# Patient Record
Sex: Female | Born: 1990 | Race: Black or African American | Hispanic: No | Smoking: Never smoker
Health system: Southern US, Community
[De-identification: ages and names within clinical notes are randomized; demographics above are authoritative.]

## PROBLEM LIST (undated history)

## (undated) ENCOUNTER — Emergency Department (HOSPITAL_COMMUNITY): Admission: EM | Payer: Medicaid Other | Source: Home / Self Care

## (undated) DIAGNOSIS — Z789 Other specified health status: Secondary | ICD-10-CM

## (undated) DIAGNOSIS — O139 Gestational [pregnancy-induced] hypertension without significant proteinuria, unspecified trimester: Secondary | ICD-10-CM

## (undated) DIAGNOSIS — I10 Essential (primary) hypertension: Secondary | ICD-10-CM

## (undated) HISTORY — DX: Essential (primary) hypertension: I10

## (undated) HISTORY — DX: Gestational (pregnancy-induced) hypertension without significant proteinuria, unspecified trimester: O13.9

---
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
## 2010-09-07 IMAGING — CR DG NECK SOFT TISSUE
2 series · 2 of 2 positions shown · non-contrast
Comparison: None.

CLINICAL DATA: Short of breath.  Something is stuck in throat.

NECK SOFT TISSUES - 1+ VIEW

[view not recorded (1 of 2)]
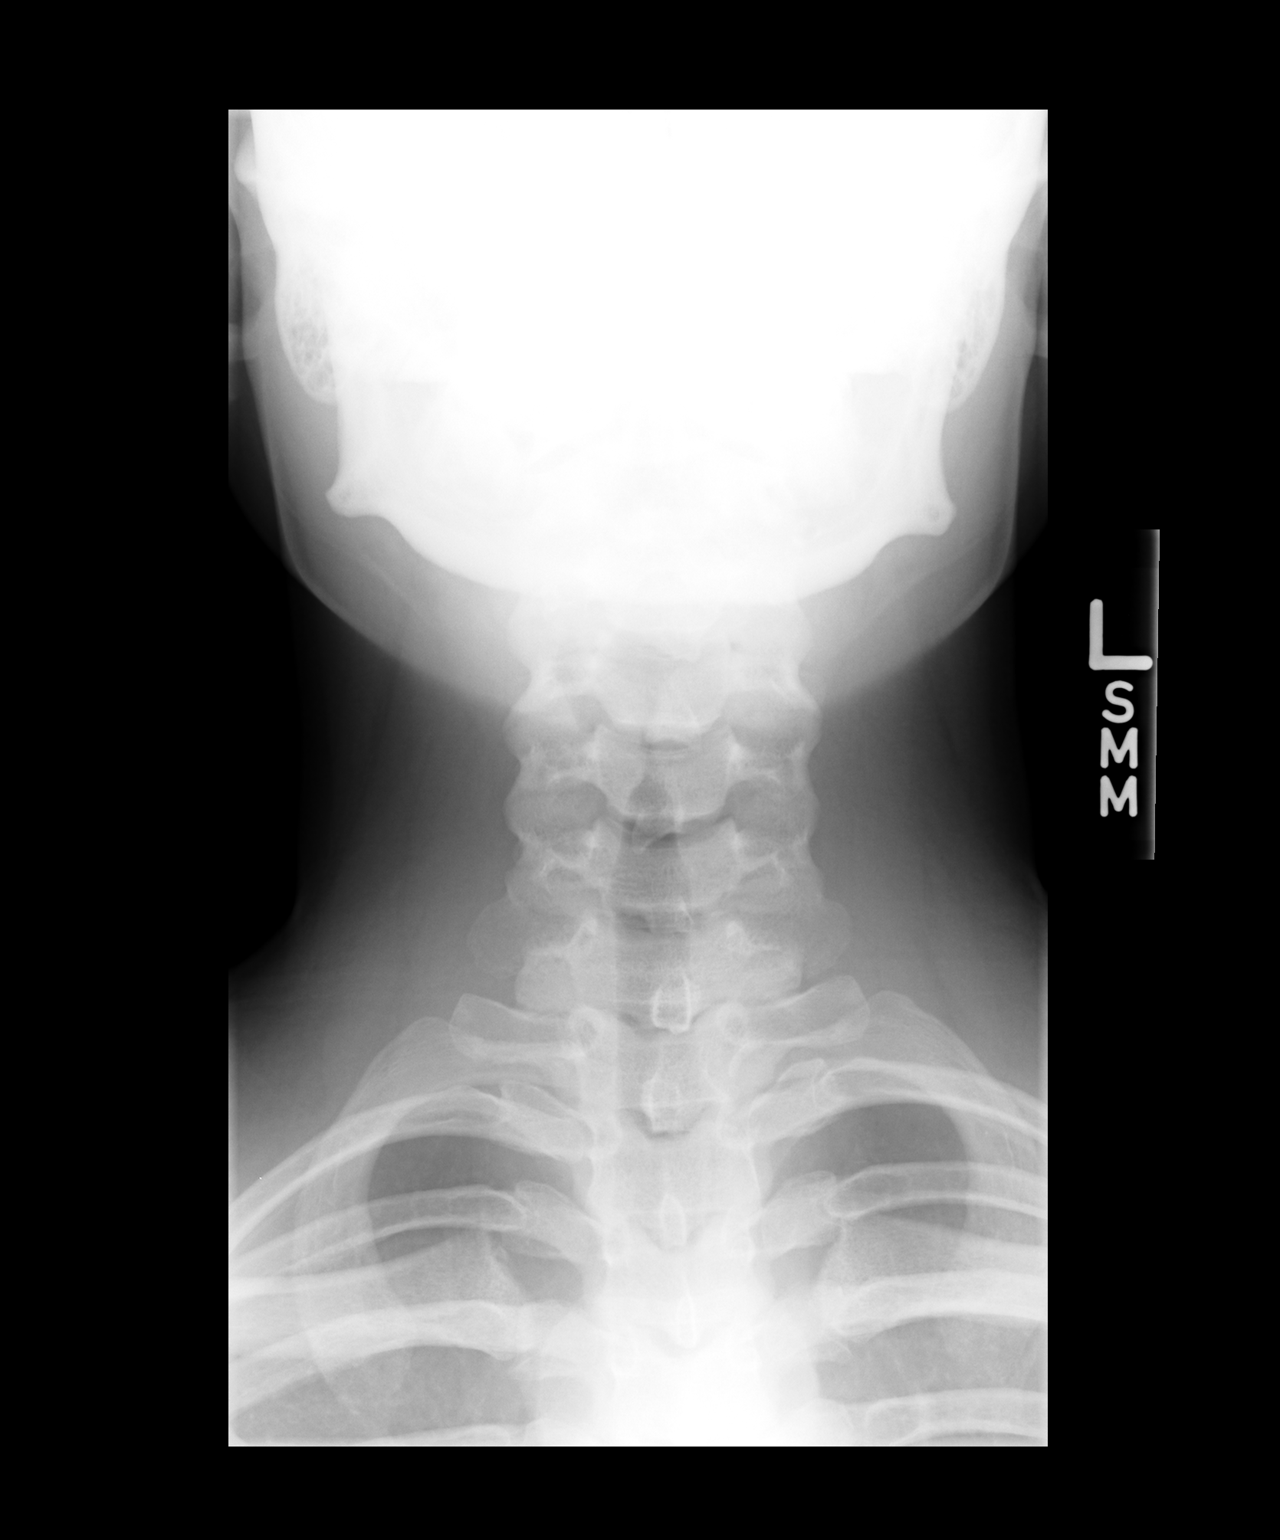

[view not recorded (2 of 2)]
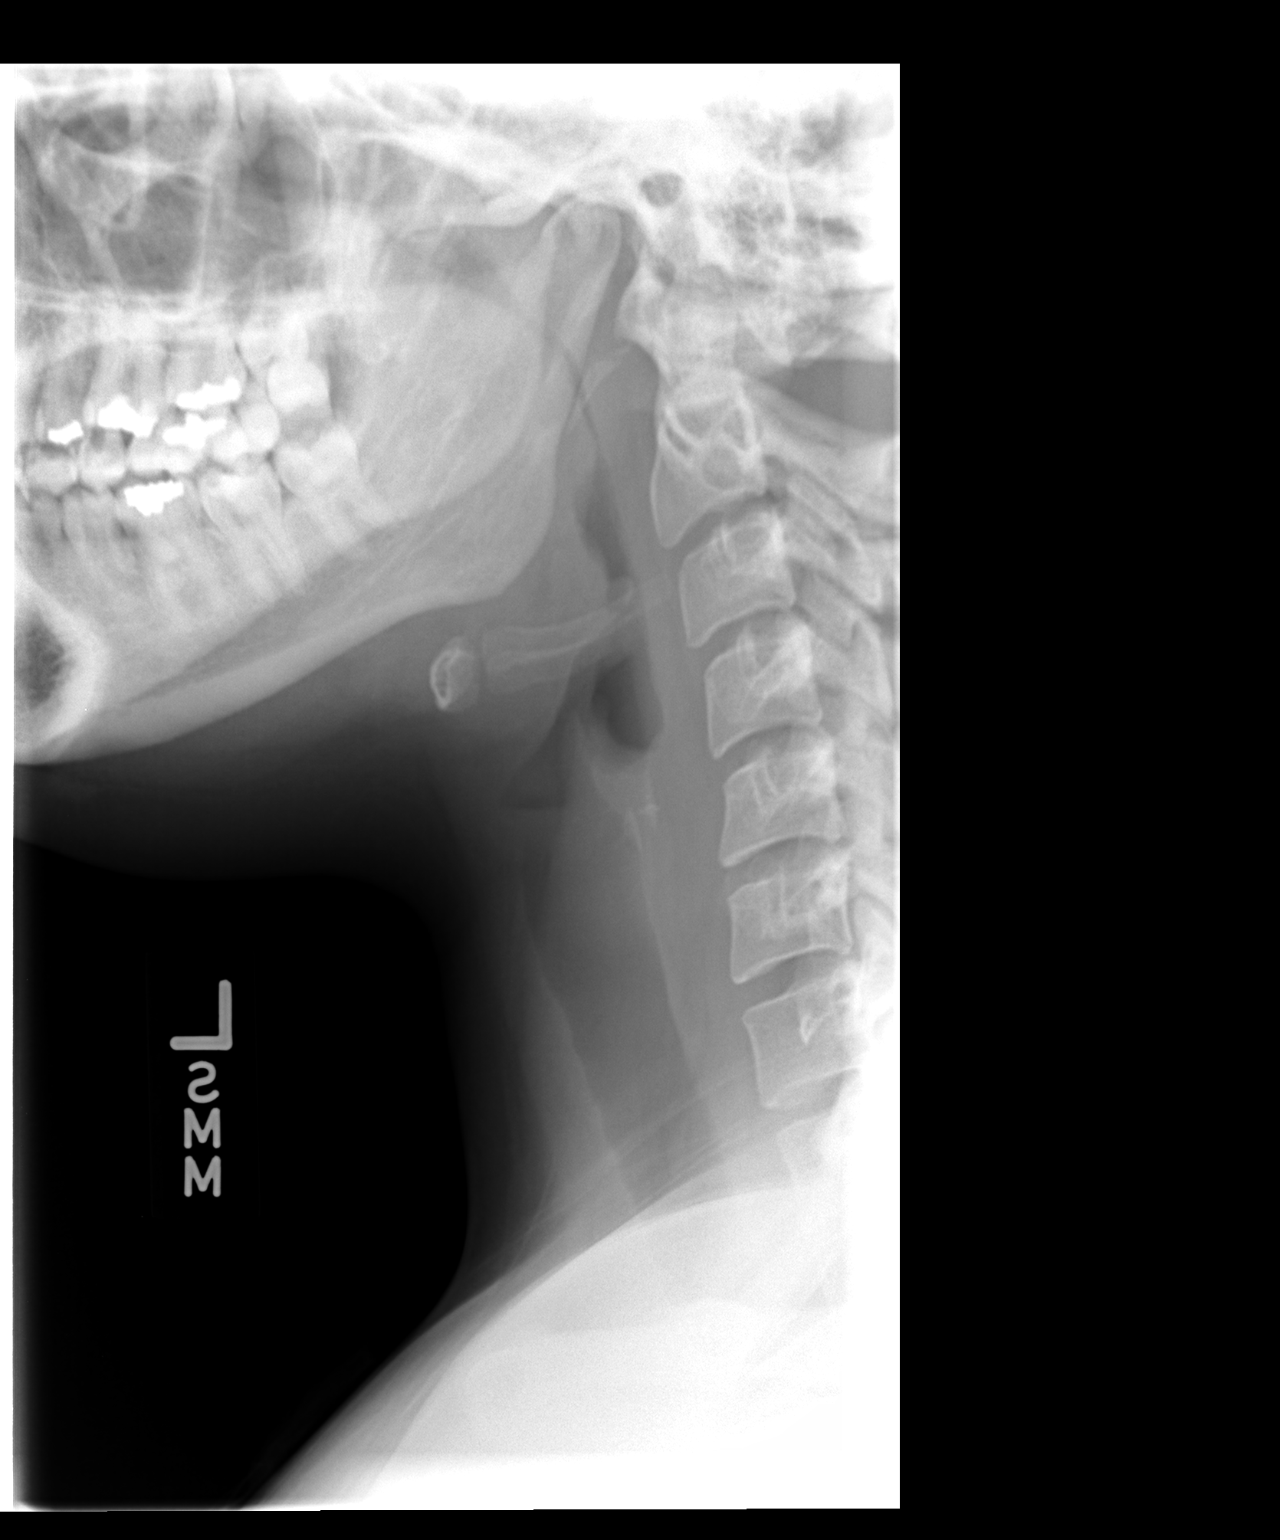

[2 of 2 positions shown; findings below may reference images not displayed]

FINDINGS: There is mild prominence of the adenoid soft tissues in
the nasopharynx which is likely due to lymphoid hyperplasia.  The
epiglottis is normal.  The glottis is normal.  The airway is normal
and the trachea appears normal.  There is no foreign body or soft
tissue swelling.
IMPRESSION: Negative

## 2011-01-04 LAB — POCT I-STAT, CHEM 8
BUN: 7 mg/dL (ref 6–23)
Calcium, Ion: 1.17 mmol/L (ref 1.12–1.32)
Chloride: 106 mEq/L (ref 96–112)
TCO2: 24 mmol/L (ref 0–100)

## 2011-01-04 LAB — POCT URINALYSIS DIP (DEVICE)
Glucose, UA: NEGATIVE mg/dL
Nitrite: NEGATIVE

## 2011-01-04 LAB — POCT PREGNANCY, URINE: Preg Test, Ur: NEGATIVE

## 2013-09-03 ENCOUNTER — Emergency Department (HOSPITAL_COMMUNITY)
Admission: EM | Admit: 2013-09-03 | Discharge: 2013-09-03 | Disposition: A | Discharge status: Home / Self Care | Payer: Self-pay | Attending: Emergency Medicine | Admitting: Emergency Medicine

## 2013-09-03 DIAGNOSIS — E669 Obesity, unspecified: Secondary | ICD-10-CM | POA: Insufficient documentation

## 2013-09-03 DIAGNOSIS — Z3202 Encounter for pregnancy test, result negative: Secondary | ICD-10-CM | POA: Insufficient documentation

## 2013-09-03 DIAGNOSIS — R112 Nausea with vomiting, unspecified: Secondary | ICD-10-CM | POA: Insufficient documentation

## 2013-09-03 DIAGNOSIS — R51 Headache: Secondary | ICD-10-CM | POA: Insufficient documentation

## 2013-09-03 DIAGNOSIS — Z77098 Contact with and (suspected) exposure to other hazardous, chiefly nonmedicinal, chemicals: Secondary | ICD-10-CM

## 2013-09-03 DIAGNOSIS — R6889 Other general symptoms and signs: Secondary | ICD-10-CM | POA: Insufficient documentation

## 2013-09-03 DIAGNOSIS — T6091XA Toxic effect of unspecified pesticide, accidental (unintentional), initial encounter: Secondary | ICD-10-CM | POA: Insufficient documentation

## 2013-09-03 DIAGNOSIS — Y92009 Unspecified place in unspecified non-institutional (private) residence as the place of occurrence of the external cause: Secondary | ICD-10-CM | POA: Insufficient documentation

## 2013-09-03 DIAGNOSIS — R42 Dizziness and giddiness: Secondary | ICD-10-CM | POA: Insufficient documentation

## 2013-09-03 DIAGNOSIS — Y9389 Activity, other specified: Secondary | ICD-10-CM | POA: Insufficient documentation

## 2013-09-03 MED ORDER — IBUPROFEN 200 MG PO TABS
600.0000 mg | ORAL_TABLET | Freq: Once | ORAL | Status: AC
  Administered 2013-09-03: 600 mg via ORAL
  Filled 2013-09-03: qty 3

## 2013-09-03 MED ORDER — ONDANSETRON HCL 4 MG/2ML IJ SOLN
4.0000 mg | Freq: Once | INTRAMUSCULAR | Status: AC
  Administered 2013-09-03: 4 mg via INTRAVENOUS
  Filled 2013-09-03: qty 2

## 2013-09-03 NOTE — ED Notes (Signed)
Bed: WU98 Expected date:  Expected time:  Means of arrival:  Comments: NV
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
## 2013-09-03 NOTE — ED Notes (Signed)
Pt denies pain at present. States she is still having burning in her throat. Pt speaks with complete sentences. Pt with no acute distress. States she is still a little nauseous.

## 2013-09-03 NOTE — ED Notes (Signed)
MD at bedside. Dr. Horton at bedside.  

## 2014-05-27 ENCOUNTER — Emergency Department (HOSPITAL_COMMUNITY)
Admission: EM | Admit: 2014-05-27 | Discharge: 2014-05-27 | Disposition: A | Discharge status: Home / Self Care | Payer: Self-pay | Attending: Emergency Medicine | Admitting: Emergency Medicine

## 2014-05-27 ENCOUNTER — Encounter (HOSPITAL_COMMUNITY): Payer: Self-pay | Admitting: Emergency Medicine

## 2014-05-27 DIAGNOSIS — R42 Dizziness and giddiness: Secondary | ICD-10-CM | POA: Insufficient documentation

## 2014-05-27 DIAGNOSIS — Z3202 Encounter for pregnancy test, result negative: Secondary | ICD-10-CM | POA: Insufficient documentation

## 2014-05-27 LAB — I-STAT CHEM 8, ED
BUN: 12 mg/dL (ref 6–23)
CALCIUM ION: 1.16 mmol/L (ref 1.12–1.23)
CHLORIDE: 102 meq/L (ref 96–112)
CREATININE: 0.8 mg/dL (ref 0.50–1.10)
Glucose, Bld: 85 mg/dL (ref 70–99)
HCT: 43 % (ref 36.0–46.0)
Hemoglobin: 14.6 g/dL (ref 12.0–15.0)
Potassium: 3.6 mEq/L — ABNORMAL LOW (ref 3.7–5.3)
Sodium: 138 mEq/L (ref 137–147)
TCO2: 24 mmol/L (ref 0–100)

## 2014-05-27 LAB — URINALYSIS, ROUTINE W REFLEX MICROSCOPIC
BILIRUBIN URINE: NEGATIVE
Glucose, UA: NEGATIVE mg/dL
HGB URINE DIPSTICK: NEGATIVE
KETONES UR: NEGATIVE mg/dL
LEUKOCYTES UA: NEGATIVE
Nitrite: NEGATIVE
Protein, ur: NEGATIVE mg/dL
SPECIFIC GRAVITY, URINE: 1.027 (ref 1.005–1.030)
UROBILINOGEN UA: 0.2 mg/dL (ref 0.0–1.0)
pH: 5.5 (ref 5.0–8.0)

## 2014-05-27 LAB — POC URINE PREG, ED: PREG TEST UR: NEGATIVE

## 2014-05-27 LAB — CBG MONITORING, ED: Glucose-Capillary: 90 mg/dL (ref 70–99)

## 2014-05-27 MED ORDER — MECLIZINE HCL 25 MG PO TABS
25.0000 mg | ORAL_TABLET | Freq: Once | ORAL | Status: AC
  Administered 2014-05-27: 25 mg via ORAL
  Filled 2014-05-27: qty 1

## 2014-05-27 MED ORDER — SODIUM CHLORIDE 0.9 % IV BOLUS (SEPSIS): 1000.0000 mL | Freq: Once | INTRAVENOUS | Status: DC

## 2014-05-27 MED ORDER — LORAZEPAM 2 MG/ML IJ SOLN
1.0000 mg | Freq: Once | INTRAMUSCULAR | Status: DC
  Filled 2014-05-27: qty 1

## 2014-05-27 NOTE — Discharge Instructions (Signed)
Dizziness °Dizziness is a common problem. It is a feeling of unsteadiness or light-headedness. You may feel like you are about to faint. Dizziness can lead to injury if you stumble or fall. A person of any age group can suffer from dizziness, but dizziness is more common in older adults. °CAUSES  °Dizziness can be caused by many different things, including: °· Middle ear problems. °· Standing for too long. °· Infections. °· An allergic reaction. °· Aging. °· An emotional response to something, such as the sight of blood. °· Side effects of medicines. °· Tiredness. °· Problems with circulation or blood pressure. °· Excessive use of alcohol or medicines, or illegal drug use. °· Breathing too fast (hyperventilation). °· An irregular heart rhythm (arrhythmia). °· A low red blood cell count (anemia). °· Pregnancy. °· Vomiting, diarrhea, fever, or other illnesses that cause body fluid loss (dehydration). °· Diseases or conditions such as Parkinson's disease, high blood pressure (hypertension), diabetes, and thyroid problems. °· Exposure to extreme heat. °DIAGNOSIS  °Your health care provider will ask about your symptoms, perform a physical exam, and perform an electrocardiogram (ECG) to record the electrical activity of your heart. Your health care provider may also perform other heart or blood tests to determine the cause of your dizziness. These may include: °· Transthoracic echocardiogram (TTE). During echocardiography, sound waves are used to evaluate how blood flows through your heart. °· Transesophageal echocardiogram (TEE). °· Cardiac monitoring. This allows your health care provider to monitor your heart rate and rhythm in real time. °· Holter monitor. This is a portable device that records your heartbeat and can help diagnose heart arrhythmias. It allows your health care provider to track your heart activity for several days if needed. °· Stress tests by exercise or by giving medicine that makes the heart beat  faster. °TREATMENT  °Treatment of dizziness depends on the cause of your symptoms and can vary greatly. °HOME CARE INSTRUCTIONS  °· Drink enough fluids to keep your urine clear or pale yellow. This is especially important in very hot weather. In older adults, it is also important in cold weather. °· Take your medicine exactly as directed if your dizziness is caused by medicines. When taking blood pressure medicines, it is especially important to get up slowly. °· Rise slowly from chairs and steady yourself until you feel okay. °· In the morning, first sit up on the side of the bed. When you feel okay, stand slowly while holding onto something until you know your balance is fine. °· Move your legs often if you need to stand in one place for a long time. Tighten and relax your muscles in your legs while standing. °· Have someone stay with you for 1-2 days if dizziness continues to be a problem. Do this until you feel you are well enough to stay alone. Have the person call your health care provider if he or she notices changes in you that are concerning. °· Do not drive or use heavy machinery if you feel dizzy. °· Do not drink alcohol. °SEEK IMMEDIATE MEDICAL CARE IF:  °· Your dizziness or light-headedness gets worse. °· You feel nauseous or vomit. °· You have problems talking, walking, or using your arms, hands, or legs. °· You feel weak. °· You are not thinking clearly or you have trouble forming sentences. It may take a friend or family member to notice this. °· You have chest pain, abdominal pain, shortness of breath, or sweating. °· Your vision changes. °· You notice   any bleeding. °· You have side effects from medicine that seems to be getting worse rather than better. °MAKE SURE YOU:  °· Understand these instructions. °· Will watch your condition. °· Will get help right away if you are not doing well or get worse. °Document Released: 03/08/2001 Document Revised: 09/17/2013 Document Reviewed: 04/01/2011 °[employer]®  Patient Information ©2015 [employer]. This information is not intended to replace advice given to you by your health care provider. Make sure you discuss any questions you have with your health care provider. ° °Syncope °Syncope is a medical term for fainting or passing out. This means you lose consciousness and drop to the ground. People are generally unconscious for less than 5 minutes. You may have some muscle twitches for up to 15 seconds before waking up and returning to normal. Syncope occurs more often in older adults, but it can happen to anyone. While most causes of syncope are not dangerous, syncope can be a sign of a serious medical problem. It is important to seek medical care.  °CAUSES  °Syncope is caused by a sudden drop in blood flow to the brain. The specific cause is often not determined. Factors that can bring on syncope include: °· Taking medicines that lower blood pressure. °· Sudden changes in posture, such as standing up quickly. °· Taking more medicine than prescribed. °· Standing in one place for too long. °· Seizure disorders. °· Dehydration and excessive exposure to heat. °· Low blood sugar (hypoglycemia). °· Straining to have a bowel movement. °· Heart disease, irregular heartbeat, or other circulatory problems. °· Fear, emotional distress, seeing blood, or severe pain. °SYMPTOMS  °Right before fainting, you may: °· Feel dizzy or light-headed. °· Feel nauseous. °· See all white or all black in your field of vision. °· Have cold, clammy skin. °DIAGNOSIS  °Your health care provider will ask about your symptoms, perform a physical exam, and perform an electrocardiogram (ECG) to record the electrical activity of your heart. Your health care provider may also perform other heart or blood tests to determine the cause of your syncope which may include: °· Transthoracic echocardiogram (TTE). During echocardiography, sound waves are used to evaluate how blood flows through your  heart. °· Transesophageal echocardiogram (TEE). °· Cardiac monitoring. This allows your health care provider to monitor your heart rate and rhythm in real time. °· Holter monitor. This is a portable device that records your heartbeat and can help diagnose heart arrhythmias. It allows your health care provider to track your heart activity for several days, if needed. °· Stress tests by exercise or by giving medicine that makes the heart beat faster. °TREATMENT  °In most cases, no treatment is needed. Depending on the cause of your syncope, your health care provider may recommend changing or stopping some of your medicines. °HOME CARE INSTRUCTIONS °· Have someone stay with you until you feel stable. °· Do not drive, use machinery, or play sports until your health care provider says it is okay. °· Keep all follow-up appointments as directed by your health care provider. °· Lie down right away if you start feeling like you might faint. Breathe deeply and steadily. Wait until all the symptoms have passed. °· Drink enough fluids to keep your urine clear or pale yellow. °· If you are taking blood pressure or heart medicine, get up slowly and take several minutes to sit and then stand. This can reduce dizziness. °SEEK IMMEDIATE MEDICAL CARE IF:  °· You have a severe headache. °· You   have unusual pain in the chest, abdomen, or back. °· You are bleeding from your mouth or rectum, or you have black or tarry stool. °· You have an irregular or very fast heartbeat. °· You have pain with breathing. °· You have repeated fainting or seizure-like jerking during an episode. °· You faint when sitting or lying down. °· You have confusion. °· You have trouble walking. °· You have severe weakness. °· You have vision problems. °If you fainted, call your local emergency services (911 in [place] Do not drive yourself to the hospital.  °MAKE SURE YOU: °· Understand these instructions. °· Will watch your condition. °· Will get help right away  if you are not doing well or get worse. °Document Released: 09/12/2005 Document Revised: 09/17/2013 Document Reviewed: 11/11/2011 °ExitCare® Patient Information ©2015 [employer]. This information is not intended to replace advice given to you by your health care provider. Make sure you discuss any questions you have with your health care provider. ° °

## 2014-05-27 NOTE — ED Notes (Signed)
States she is normally not late on menstrual.  Is sexually active.  1 week over due.

## 2014-05-27 NOTE — ED Notes (Signed)
Pt ambulated to bathroom with no assistance. Rn notified

## 2014-05-27 NOTE — ED Provider Notes (Signed)
CSN: [id number]     Arrival date & time 05/27/14  1616 History   First MD Initiated Contact with Patient 05/27/14 2105     Chief Complaint  Patient presents with  . Dizziness     (Consider location/radiation/quality/duration/timing/severity/associated sxs/prior Treatment) HPI Is a 23 year old female who presents emergency department chief complaint of dizziness and syncope. She has a past medical history morbid obesity and previous history of syncope and middle school. Patient states she was walking up a hill in midday he. Temperature today was in the high 90sF. This was walking up a hill she became dizzy and had to sit down. She states she briefly lost consciousness while sitting, however she did not fall or hit her head. She denies racing or skipping in her heart, she denies melena or hematochezia.    History reviewed. No pertinent past medical history. History reviewed. No pertinent past surgical history. History reviewed. No pertinent family history. History  Substance Use Topics  . Smoking status: Never Smoker   . Smokeless tobacco: Not on file  . Alcohol Use: Yes     Comment: social   OB History   Grav Para Term Preterm Abortions TAB SAB Ect Mult Living                 Review of Systems  Ten systems reviewed and are negative for acute change, except as noted in the HPI.     Allergies  Review of patient's allergies indicates no known allergies.  Home Medications   Prior to Admission medications   Not on File   BP 103/69  Pulse 90  Temp(Src) 97.9 F (36.6 C) (Oral)  Resp 18  SpO2 98%  LMP 04/18/2014 Physical Exam  Nursing note and vitals reviewed. Constitutional: She is oriented to person, place, and time. She appears well-developed and well-nourished. No distress.  Morbidly obese female in nad  HENT:  Head: Normocephalic and atraumatic.  Eyes: Conjunctivae are normal. No scleral icterus.  Neck: Normal range of motion.  Cardiovascular: Normal rate,  regular rhythm and normal heart sounds.  Exam reveals no gallop and no friction rub.   No murmur heard. Pulmonary/Chest: Effort normal and breath sounds normal. No respiratory distress.  Abdominal: Soft. Bowel sounds are normal. She exhibits no distension and no mass. There is no tenderness. There is no guarding.  Neurological: She is alert and oriented to person, place, and time.  Skin: Skin is warm and dry. She is not diaphoretic.  Psychiatric: Her behavior is normal.    ED Course  Procedures (including critical care time) Labs Review Labs Reviewed  URINALYSIS, ROUTINE W REFLEX MICROSCOPIC  POC URINE PREG, ED  CBG MONITORING, ED  I-STAT CHEM 8, ED    Imaging Review No results found.   EKG Interpretation   Date/Time:  Tuesday May 27 2014 16:49:58 EDT Ventricular Rate:  106 PR Interval:  150 QRS Duration: 84 QT Interval:  326 QTC Calculation: 433 R Axis:   12 Text Interpretation:  Sinus tachycardia Borderline ST elevation, lateral  leads No old tracing to compare Confirmed by [facility]  MD, ELLIOTT [phone]) on  05/27/2014 4:58:18 PM      MDM   Final diagnoses:  Dizziness    Patient with no abnormalities on exam. Negative orthostatics. Questionable syncope.  Feel that her sxs were related to heat. Labs are without acute abnormality /. No signs of arrhythmia. Appears safe for discharge. i have discussed the case with Dr Denton Lank who agrees with poc.  The  patient appears reasonably screened and/or stabilized for discharge and I doubt any other medical condition or other [facility] requiring further screening, evaluation, or treatment in the ED at this time prior to discharge.    Arthor Captain, PA-C 05/29/14 [phone]

## 2014-05-27 NOTE — ED Notes (Signed)
Pt walking to bus stop.  Pt became light headed and dizzy.  Sat down on ground.  Pt unable to get back up.  911 called for transport.  No change in dizziness.  Vitals: 142/74, hr 100, resp 18, 100% ra, cbg 113

## 2014-06-03 NOTE — ED Provider Notes (Signed)
Medical screening examination/treatment/procedure(s) were conducted as a shared visit with non-physician practitioner(s) and myself.  I personally evaluated the patient during the encounter.   EKG Interpretation   Date/Time:  Tuesday May 27 2014 16:49:58 EDT Ventricular Rate:  106 PR Interval:  150 QRS Duration: 84 QT Interval:  326 QTC Calculation: 433 R Axis:   12 Text Interpretation:  Sinus tachycardia Borderline ST elevation, lateral  leads No old tracing to compare Confirmed by [facility]  MD, ELLIOTT [phone]) on  05/27/2014 4:58:18 PM      Pt c/o lightheadedness/dizziness. No chest pain. No palpitations. No los. Monitor. Labs. Chest cta. Rrr.   Suzi Roots, MD 06/03/14 [phone]

## 2014-09-09 ENCOUNTER — Emergency Department (HOSPITAL_COMMUNITY): Payer: Self-pay

## 2014-09-09 ENCOUNTER — Encounter (HOSPITAL_COMMUNITY): Payer: Self-pay

## 2014-09-09 ENCOUNTER — Emergency Department (HOSPITAL_COMMUNITY)
Admission: EM | Admit: 2014-09-09 | Discharge: 2014-09-10 | Disposition: A | Discharge status: Home / Self Care | Payer: Self-pay | Attending: Emergency Medicine | Admitting: Emergency Medicine

## 2014-09-09 DIAGNOSIS — R102 Pelvic and perineal pain: Secondary | ICD-10-CM

## 2014-09-09 DIAGNOSIS — Z3202 Encounter for pregnancy test, result negative: Secondary | ICD-10-CM | POA: Insufficient documentation

## 2014-09-09 DIAGNOSIS — B9689 Other specified bacterial agents as the cause of diseases classified elsewhere: Secondary | ICD-10-CM

## 2014-09-09 DIAGNOSIS — N760 Acute vaginitis: Secondary | ICD-10-CM | POA: Insufficient documentation

## 2014-09-09 DIAGNOSIS — R10819 Abdominal tenderness, unspecified site: Secondary | ICD-10-CM

## 2014-09-09 DIAGNOSIS — R112 Nausea with vomiting, unspecified: Secondary | ICD-10-CM | POA: Insufficient documentation

## 2014-09-09 LAB — CBC WITH DIFFERENTIAL/PLATELET
BASOS PCT: 0 % (ref 0–1)
Basophils Absolute: 0 10*3/uL (ref 0.0–0.1)
Eosinophils Absolute: 0 10*3/uL (ref 0.0–0.7)
Eosinophils Relative: 0 % (ref 0–5)
HCT: 37.6 % (ref 36.0–46.0)
HEMOGLOBIN: 12.1 g/dL (ref 12.0–15.0)
Lymphocytes Relative: 21 % (ref 12–46)
Lymphs Abs: 2 10*3/uL (ref 0.7–4.0)
MCH: 24.9 pg — ABNORMAL LOW (ref 26.0–34.0)
MCHC: 32.2 g/dL (ref 30.0–36.0)
MCV: 77.4 fL — ABNORMAL LOW (ref 78.0–100.0)
MONO ABS: 0.8 10*3/uL (ref 0.1–1.0)
MONOS PCT: 8 % (ref 3–12)
NEUTROS ABS: 6.6 10*3/uL (ref 1.7–7.7)
Neutrophils Relative %: 71 % (ref 43–77)
PLATELETS: 324 10*3/uL (ref 150–400)
RBC: 4.86 MIL/uL (ref 3.87–5.11)
RDW: 14.7 % (ref 11.5–15.5)
WBC: 9.4 10*3/uL (ref 4.0–10.5)

## 2014-09-09 LAB — COMPREHENSIVE METABOLIC PANEL
ALBUMIN: 3.6 g/dL (ref 3.5–5.2)
ALK PHOS: 81 U/L (ref 39–117)
ALT: 13 U/L (ref 0–35)
ANION GAP: 14 (ref 5–15)
AST: 16 U/L (ref 0–37)
BILIRUBIN TOTAL: 0.7 mg/dL (ref 0.3–1.2)
BUN: 10 mg/dL (ref 6–23)
CALCIUM: 9.6 mg/dL (ref 8.4–10.5)
CO2: 22 mEq/L (ref 19–32)
CREATININE: 0.61 mg/dL (ref 0.50–1.10)
Chloride: 103 mEq/L (ref 96–112)
GFR calc non Af Amer: >90 mL/min (ref >90)
GLUCOSE: 76 mg/dL (ref 70–99)
Potassium: 3.7 mEq/L (ref 3.7–5.3)
Sodium: 139 mEq/L (ref 137–147)
TOTAL PROTEIN: 8.2 g/dL (ref 6.0–8.3)

## 2014-09-09 LAB — URINALYSIS, ROUTINE W REFLEX MICROSCOPIC
Glucose, UA: NEGATIVE mg/dL
Ketones, ur: 40 mg/dL — AB
Leukocytes, UA: NEGATIVE
NITRITE: NEGATIVE
Protein, ur: NEGATIVE mg/dL
Specific Gravity, Urine: 1.035 — ABNORMAL HIGH (ref 1.005–1.030)
UROBILINOGEN UA: 1 mg/dL (ref 0.0–1.0)
pH: 5 (ref 5.0–8.0)

## 2014-09-09 LAB — URINE MICROSCOPIC-ADD ON

## 2014-09-09 LAB — LIPASE, BLOOD: LIPASE: 23 U/L (ref 11–59)

## 2014-09-09 LAB — WET PREP, GENITAL
TRICH WET PREP: NONE SEEN
WBC, Wet Prep HPF POC: NONE SEEN

## 2014-09-09 LAB — PREGNANCY, URINE: PREG TEST UR: NEGATIVE

## 2014-09-09 MED ORDER — ONDANSETRON 4 MG PO TBDP
4.0000 mg | ORAL_TABLET | Freq: Once | ORAL | Status: AC
  Administered 2014-09-09: 4 mg via ORAL
  Filled 2014-09-09: qty 1

## 2014-09-09 MED ORDER — HYDROCODONE-ACETAMINOPHEN 5-325 MG PO TABS
1.0000 | ORAL_TABLET | Freq: Once | ORAL | Status: AC
  Administered 2014-09-09: 1 via ORAL
  Filled 2014-09-09: qty 1

## 2014-09-09 MED ORDER — ONDANSETRON HCL 4 MG/2ML IJ SOLN
4.0000 mg | Freq: Once | INTRAMUSCULAR | Status: DC
  Filled 2014-09-09: qty 2

## 2014-09-09 MED ORDER — SODIUM CHLORIDE 0.9 % IV BOLUS (SEPSIS): 1000.0000 mL | Freq: Once | INTRAVENOUS | Status: DC

## 2014-09-09 NOTE — ED Notes (Signed)
Informed pt that we need a urine sample. Pt said that she already went in the waiting area and is unable to go at this time.

## 2014-09-09 NOTE — ED Provider Notes (Signed)
CSN: [id number]     Arrival date & time 09/09/14  1649 History   First MD Initiated Contact with Patient 09/09/14 2034     Chief Complaint  Patient presents with  . Abdominal Pain     (Consider location/radiation/quality/duration/timing/severity/associated sxs/prior Treatment) HPI  Becky Porter is a 23 y.o. female presenting with 2 weeks of sharp left-sided abdominal pain that is intermittent. Patient states she has also had associated nausea and vomiting. Emesis is food contents no bile or blood or coffee grounds. Patient denies any diarrhea. She was able to hold down a salad yesterday. Patient denies any urinary symptoms. Patient states she is sexually active and does not use condoms. She has no other form of contraception. She denies any increase in discharge or foul smell to her discharge. She reports having an STD prior but was treated in years ago. Patient's last BM yesterday and normal, nonbloody. She denies having any imaging of her abdomen prior. No fever chills. No chest pain or back pain.   History reviewed. No pertinent past medical history. History reviewed. No pertinent past surgical history. History reviewed. No pertinent family history. History  Substance Use Topics  . Smoking status: Never Smoker   . Smokeless tobacco: Not on file  . Alcohol Use: Yes     Comment: social   OB History    No data available     Review of Systems  Constitutional: Negative for fever and chills.  HENT: Negative for congestion and rhinorrhea.   Eyes: Negative for visual disturbance.  Respiratory: Negative for cough and shortness of breath.   Cardiovascular: Negative for chest pain and palpitations.  Gastrointestinal: Positive for nausea, vomiting and abdominal pain. Negative for diarrhea.  Genitourinary: Negative for dysuria and hematuria.  Musculoskeletal: Negative for back pain and gait problem.  Skin: Negative for rash.  Neurological: Negative for weakness and headaches.       Allergies  Review of patient's allergies indicates no known allergies.  Home Medications   Prior to Admission medications   Medication Sig Start Date End Date Taking? Authorizing Provider  metroNIDAZOLE (FLAGYL) 500 MG tablet Take 1 tablet (500 mg total) by mouth 2 (two) times daily. 09/10/14   Louann SjogrenVictoria L Jarius Dieudonne, PA-C  ondansetron (ZOFRAN) 4 MG tablet Take 1 tablet (4 mg total) by mouth every 6 (six) hours. 09/10/14   Benetta SparVictoria L Rowdy Guerrini, PA-C   BP 117/72 mmHg  Pulse 89  Temp(Src) 98 F (36.7 C) (Oral)  Resp 17  SpO2 99%  LMP 08/18/2014 Physical Exam  Constitutional: She appears well-developed and well-nourished. No distress.  HENT:  Head: Normocephalic and atraumatic.  Eyes: Conjunctivae and EOM are normal. Right eye exhibits no discharge. Left eye exhibits no discharge.  Cardiovascular: Normal rate, regular rhythm and normal heart sounds.   Pulmonary/Chest: Effort normal and breath sounds normal. No respiratory distress. She has no wheezes.  Abdominal: Soft. Bowel sounds are normal. She exhibits no distension.  Mild abdominal tenderness left to the umbilicus without rebound, guarding, rigidity. No CVA tenderness or back tenderness.  Genitourinary:  Cervix pink without lesions. Os closed. No CMT mild left adnexal tenderness, no mass. No right adnexal tenderness or mass appreciated. Moderate white opaque discharge with fishy odor. Nursing tech in room for exam.  Neurological: She is alert. She exhibits normal muscle tone. Coordination normal.  Skin: Skin is warm and dry. She is not diaphoretic.  Nursing note and vitals reviewed.   ED Course  Procedures (including critical care time) Labs Review Labs  Reviewed  WET PREP, GENITAL - Abnormal; Notable for the following:    Yeast Wet Prep HPF POC RARE YEAST (*)    Clue Cells Wet Prep HPF POC MODERATE (*)    All other components within normal limits  CBC WITH DIFFERENTIAL - Abnormal; Notable for the following:    MCV 77.4  (*)    MCH 24.9 (*)    All other components within normal limits  URINALYSIS, ROUTINE W REFLEX MICROSCOPIC - Abnormal; Notable for the following:    Color, Urine AMBER (*)    APPearance CLOUDY (*)    Specific Gravity, Urine 1.035 (*)    Hgb urine dipstick TRACE (*)    Bilirubin Urine SMALL (*)    Ketones, ur 40 (*)    All other components within normal limits  URINE MICROSCOPIC-ADD ON - Abnormal; Notable for the following:    Squamous Epithelial / LPF MANY (*)    All other components within normal limits  GC/CHLAMYDIA PROBE AMP  COMPREHENSIVE METABOLIC PANEL  LIPASE, BLOOD  PREGNANCY, URINE  RPR  HIV ANTIBODY (ROUTINE TESTING)    Imaging Review [place] Transvaginal Non-ob  09/10/2014   CLINICAL DATA:  Acute pelvic pain.  EXAM: TRANSABDOMINAL AND TRANSVAGINAL ULTRASOUND OF PELVIS  DOPPLER ULTRASOUND OF OVARIES  TECHNIQUE: Both transabdominal and transvaginal ultrasound examinations of the pelvis were performed. Transabdominal technique was performed for global imaging of the pelvis including uterus, ovaries, adnexal regions, and pelvic cul-de-sac.  It was necessary to proceed with endovaginal exam following the transabdominal exam to visualize the endometrium and ovaries. Color and duplex Doppler ultrasound was utilized to evaluate blood flow to the ovaries.  COMPARISON:  None.  FINDINGS: Uterus  Measurements: 6.0 x 3.4 x 3.0 cm. No fibroids or other mass visualized.  Endometrium  Thickness: 5.8 mm which is within normal limits. No focal abnormality visualized.  Right ovary  Measurements: 3.9 x 2.3 x 1.7 cm. Normal appearance/no adnexal mass.  Left ovary  Measurements: 3.4 x 2.3 x 1.9 cm. Normal appearance/no adnexal mass.  Pulsed Doppler evaluation of both ovaries demonstrates normal low-resistance arterial and venous waveforms.  Other findings  No free fluid.  IMPRESSION: No abnormality seen in the pelvis.   Electronically Signed   By: Roque LiasJames  Green M.D.   On: 09/10/2014 01:34   [place] Pelvis  Complete  09/10/2014   CLINICAL DATA:  Acute pelvic pain.  EXAM: TRANSABDOMINAL AND TRANSVAGINAL ULTRASOUND OF PELVIS  DOPPLER ULTRASOUND OF OVARIES  TECHNIQUE: Both transabdominal and transvaginal ultrasound examinations of the pelvis were performed. Transabdominal technique was performed for global imaging of the pelvis including uterus, ovaries, adnexal regions, and pelvic cul-de-sac.  It was necessary to proceed with endovaginal exam following the transabdominal exam to visualize the endometrium and ovaries. Color and duplex Doppler ultrasound was utilized to evaluate blood flow to the ovaries.  COMPARISON:  None.  FINDINGS: Uterus  Measurements: 6.0 x 3.4 x 3.0 cm. No fibroids or other mass visualized.  Endometrium  Thickness: 5.8 mm which is within normal limits. No focal abnormality visualized.  Right ovary  Measurements: 3.9 x 2.3 x 1.7 cm. Normal appearance/no adnexal mass.  Left ovary  Measurements: 3.4 x 2.3 x 1.9 cm. Normal appearance/no adnexal mass.  Pulsed Doppler evaluation of both ovaries demonstrates normal low-resistance arterial and venous waveforms.  Other findings  No free fluid.  IMPRESSION: No abnormality seen in the pelvis.   Electronically Signed   By: Roque LiasJames  Green M.D.   On: 09/10/2014 01:34   [place]  Art/ven Flow Abd Pelv Doppler  09/10/2014   CLINICAL DATA:  Acute pelvic pain.  EXAM: TRANSABDOMINAL AND TRANSVAGINAL ULTRASOUND OF PELVIS  DOPPLER ULTRASOUND OF OVARIES  TECHNIQUE: Both transabdominal and transvaginal ultrasound examinations of the pelvis were performed. Transabdominal technique was performed for global imaging of the pelvis including uterus, ovaries, adnexal regions, and pelvic cul-de-sac.  It was necessary to proceed with endovaginal exam following the transabdominal exam to visualize the endometrium and ovaries. Color and duplex Doppler ultrasound was utilized to evaluate blood flow to the ovaries.  COMPARISON:  None.  FINDINGS: Uterus  Measurements: 6.0 x 3.4 x 3.0 cm.  No fibroids or other mass visualized.  Endometrium  Thickness: 5.8 mm which is within normal limits. No focal abnormality visualized.  Right ovary  Measurements: 3.9 x 2.3 x 1.7 cm. Normal appearance/no adnexal mass.  Left ovary  Measurements: 3.4 x 2.3 x 1.9 cm. Normal appearance/no adnexal mass.  Pulsed Doppler evaluation of both ovaries demonstrates normal low-resistance arterial and venous waveforms.  Other findings  No free fluid.  IMPRESSION: No abnormality seen in the pelvis.   Electronically Signed   By: Roque Lias M.D.   On: 09/10/2014 01:34   Dg Abd 2 Views  09/09/2014   CLINICAL DATA:  Abdominal pain for 2 weeks with nausea and vomiting  EXAM: ABDOMEN - 2 VIEW  COMPARISON:  None.  FINDINGS: The bowel gas pattern is normal. There is no evidence of free air. No radio-opaque calculi or other significant radiographic abnormality is seen.  IMPRESSION: Negative.   Electronically Signed   By: Tiburcio Pea M.D.   On: 09/09/2014 23:05     EKG Interpretation None      MDM   Final diagnoses:  Abdominal tenderness  Pelvic pain in female  Nausea and vomiting, vomiting of unspecified type  Bacterial vaginosis   Patient with 2 weeks of sharp/crampy abdominal pain. No fevers, chills. Patient also with nausea and vomiting. VSS. Mild abdominal tenderness left to the umbilicus without rebound, guarding, rigidity. Patient without a surgical abdomen. Patient with clue cells and fishy odor and moderate discharge, cervix normal. Pt diagnosed with BV. Patient given flagyl and instructed to not drink alcohol while taking it. Patient also tested for other STDs. Wet prep with no WBC, patient without CMT or adnexal tenderness and cervix normal. I doubt PID. Patient with improvement of her pain in ED. Patient tolerating fluids by mouth. Patient did not vomit while in ED. On repeat abdominal exam patient with improvement of her abdominal tenderness and she does not have any signs of peritonitis. Pelvic  ultrasound without evidence of torsion or explanation for patient's abdominal pain. Patient is afebrile, nontoxic, and in no acute distress. Patient is appropriate for outpatient management and is stable for discharge. Patient to follow-up with [facility]. I have also discussed reasons to return immediately to the ER.  Patient expresses understanding and agrees with plan.  Discussed return precautions with patient. Discussed all results and patient verbalizes understanding and agrees with plan.     Louann Sjogren, PA-C 09/10/14 1511  Gilda Crease, MD 09/10/14 1536

## 2014-09-09 NOTE — ED Notes (Signed)
Pt has been having intermittent abd pain for the past 2 weeks. Has not been able to hold down liquids when the pain comes. No diarrhea. No urinary symptoms.

## 2014-09-09 NOTE — ED Notes (Signed)
Patient transported to Ultrasound 

## 2014-09-10 LAB — RPR

## 2014-09-10 LAB — GC/CHLAMYDIA PROBE AMP
CT Probe RNA: NEGATIVE
GC PROBE AMP APTIMA: NEGATIVE

## 2014-09-10 LAB — HIV ANTIBODY (ROUTINE TESTING W REFLEX): HIV 1&2 Ab, 4th Generation: NONREACTIVE

## 2014-09-10 MED ORDER — ONDANSETRON HCL 4 MG PO TABS: 4.0000 mg | ORAL_TABLET | Freq: Four times a day (QID) | ORAL | Status: DC

## 2014-09-10 MED ORDER — METRONIDAZOLE 500 MG PO TABS: 500.0000 mg | ORAL_TABLET | Freq: Two times a day (BID) | ORAL | Status: DC

## 2014-09-10 NOTE — ED Provider Notes (Signed)
Received signout on this patient who is awaiting results of ultrasound.  These results are negative.  Patient will be discharged home with instructions per PA,[place]  Arman FilterGail K Gilbert Narain, NP 09/10/14 [id number]  Gilda Creasehristopher J. Pollina, MD 09/10/14 1534

## 2014-09-10 NOTE — Discharge Instructions (Signed)
Return to the emergency room with worsening of symptoms, new symptoms or with symptoms that are concerning, especially fevers, unable to tolerate fluids, severe abdominal pain, vomiting blood or blood in your stool. [facility]. Take Flagyl for bacterial vaginosis twice daily. Do not drink any alcohol for this will make you very sick while you are taking Flagyl. Take Tylenol and ibuprofen for generalized pain.   Abdominal Pain, Women Abdominal (stomach, pelvic, or belly) pain can be caused by many things. It is important to tell your doctor:  The location of the pain.  Does it come and go or is it present all the time?  Are there things that start the pain (eating certain foods, exercise)?  Are there other symptoms associated with the pain (fever, nausea, vomiting, diarrhea)? All of this is helpful to know when trying to find the cause of the pain. CAUSES   Stomach: virus or bacteria infection, or ulcer.  Intestine: appendicitis (inflamed appendix), regional ileitis (Crohn's disease), ulcerative colitis (inflamed colon), irritable bowel syndrome, diverticulitis (inflamed diverticulum of the colon), or cancer of the stomach or intestine.  Gallbladder disease or stones in the gallbladder.  Kidney disease, kidney stones, or infection.  Pancreas infection or cancer.  Fibromyalgia (pain disorder).  Diseases of the female organs:  Uterus: fibroid (non-cancerous) tumors or infection.  Fallopian tubes: infection or tubal pregnancy.  Ovary: cysts or tumors.  Pelvic adhesions (scar tissue).  Endometriosis (uterus lining tissue growing in the pelvis and on the pelvic organs).  Pelvic congestion syndrome (female organs filling up with blood just before the menstrual period).  Pain with the menstrual period.  Pain with ovulation (producing an egg).  Pain with an IUD (intrauterine device, birth control) in the uterus.  Cancer of the  female organs.  Functional pain (pain not caused by a disease, may improve without treatment).  Psychological pain.  Depression. DIAGNOSIS  Your doctor will decide the seriousness of your pain by doing an examination.  Blood tests.  X-rays.  Ultrasound.  CT scan (computed tomography, special type of X-ray).  MRI (magnetic resonance imaging).  Cultures, for infection.  Barium enema (dye inserted in the large intestine, to better view it with X-rays).  Colonoscopy (looking in intestine with a lighted tube).  Laparoscopy (minor surgery, looking in abdomen with a lighted tube).  Major abdominal exploratory surgery (looking in abdomen with a large incision). TREATMENT  The treatment will depend on the cause of the pain.   Many cases can be observed and treated at home.  Over-the-counter medicines recommended by your caregiver.  Prescription medicine.  Antibiotics, for infection.  Birth control pills, for painful periods or for ovulation pain.  Hormone treatment, for endometriosis.  Nerve blocking injections.  Physical therapy.  Antidepressants.  Counseling with a psychologist or psychiatrist.  Minor or major surgery. HOME CARE INSTRUCTIONS   Do not take laxatives, unless directed by your caregiver.  Take over-the-counter pain medicine only if ordered by your caregiver. Do not take aspirin because it can cause an upset stomach or bleeding.  Try a clear liquid diet (broth or water) as ordered by your caregiver. Slowly move to a bland diet, as tolerated, if the pain is related to the stomach or intestine.  Have a thermometer and take your temperature several times a day, and record it.  Bed rest and sleep, if it helps the pain.  Avoid sexual intercourse, if it causes pain.  Avoid stressful situations.  Keep your  follow-up appointments and tests, as your caregiver orders.  If the pain does not go away with medicine or surgery, you may  try:  Acupuncture.  Relaxation exercises (yoga, meditation).  Group therapy.  Counseling. SEEK MEDICAL CARE IF:   You notice certain foods cause stomach pain.  Your home care treatment is not helping your pain.  You need stronger pain medicine.  You want your IUD removed.  You feel faint or lightheaded.  You develop nausea and vomiting.  You develop a rash.  You are having side effects or an allergy to your medicine. SEEK IMMEDIATE MEDICAL CARE IF:   Your pain does not go away or gets worse.  You have a fever.  Your pain is felt only in portions of the abdomen. The right side could possibly be appendicitis. The left lower portion of the abdomen could be colitis or diverticulitis.  You are passing blood in your stools (bright red or black tarry stools, with or without vomiting).  You have blood in your urine.  You develop chills, with or without a fever.  You pass out. MAKE SURE YOU:   Understand these instructions.  Will watch your condition.  Will get help right away if you are not doing well or get worse. Document Released: 07/10/2007 Document Revised: 01/27/2014 Document Reviewed: 07/30/2009 [facility] Patient Information 2015 [employer] [place] [place]. This information is not intended to replace advice given to you by your health care provider. Make sure you discuss any questions you have with your health care provider.

## 2014-09-10 NOTE — ED Notes (Signed)
Pt a/o x 4 on d/c with taxi cab voucher.

## 2014-09-10 NOTE — ED Notes (Addendum)
Bus pass given to patient; Per PA pt will stay in room til 5 am when buses start running

## 2015-11-09 ENCOUNTER — Emergency Department (HOSPITAL_COMMUNITY)
Admission: EM | Admit: 2015-11-09 | Discharge: 2015-11-09 | Disposition: A | Discharge status: Home / Self Care | Payer: Managed Care, Other (non HMO) | Attending: Emergency Medicine | Admitting: Emergency Medicine

## 2015-11-09 ENCOUNTER — Encounter (HOSPITAL_COMMUNITY): Payer: Self-pay | Admitting: *Deleted

## 2015-11-09 DIAGNOSIS — N390 Urinary tract infection, site not specified: Secondary | ICD-10-CM | POA: Diagnosis not present

## 2015-11-09 DIAGNOSIS — Z3202 Encounter for pregnancy test, result negative: Secondary | ICD-10-CM | POA: Insufficient documentation

## 2015-11-09 DIAGNOSIS — R1110 Vomiting, unspecified: Secondary | ICD-10-CM | POA: Insufficient documentation

## 2015-11-09 DIAGNOSIS — R509 Fever, unspecified: Secondary | ICD-10-CM | POA: Diagnosis present

## 2015-11-09 LAB — URINALYSIS, ROUTINE W REFLEX MICROSCOPIC
Bilirubin Urine: NEGATIVE
GLUCOSE, UA: NEGATIVE mg/dL
HGB URINE DIPSTICK: NEGATIVE
Ketones, ur: NEGATIVE mg/dL
Leukocytes, UA: NEGATIVE
Nitrite: POSITIVE — AB
PROTEIN: NEGATIVE mg/dL
SPECIFIC GRAVITY, URINE: 1.025 (ref 1.005–1.030)
pH: 5 (ref 5.0–8.0)

## 2015-11-09 LAB — CBC
HCT: 36.8 % (ref 36.0–46.0)
Hemoglobin: 11.6 g/dL — ABNORMAL LOW (ref 12.0–15.0)
MCH: 24.8 pg — AB (ref 26.0–34.0)
MCHC: 31.5 g/dL (ref 30.0–36.0)
MCV: 78.6 fL (ref 78.0–100.0)
PLATELETS: 370 10*3/uL (ref 150–400)
RBC: 4.68 MIL/uL (ref 3.87–5.11)
RDW: 14.1 % (ref 11.5–15.5)
WBC: 6.9 10*3/uL (ref 4.0–10.5)

## 2015-11-09 LAB — COMPREHENSIVE METABOLIC PANEL
ALT: 12 U/L — ABNORMAL LOW (ref 14–54)
AST: 13 U/L — AB (ref 15–41)
Albumin: 3.2 g/dL — ABNORMAL LOW (ref 3.5–5.0)
Alkaline Phosphatase: 67 U/L (ref 38–126)
Anion gap: 11 (ref 5–15)
BUN: 10 mg/dL (ref 6–20)
CHLORIDE: 106 mmol/L (ref 101–111)
CO2: 22 mmol/L (ref 22–32)
Calcium: 9 mg/dL (ref 8.9–10.3)
Creatinine, Ser: 0.83 mg/dL (ref 0.44–1.00)
GFR calc non Af Amer: >60 mL/min (ref >60)
Glucose, Bld: 98 mg/dL (ref 65–99)
Potassium: 3.8 mmol/L (ref 3.5–5.1)
Sodium: 139 mmol/L (ref 135–145)
Total Bilirubin: 0.4 mg/dL (ref 0.3–1.2)
Total Protein: 7.2 g/dL (ref 6.5–8.1)

## 2015-11-09 LAB — I-STAT BETA HCG BLOOD, ED (MC, WL, AP ONLY): I-stat hCG, quantitative: <5 m[IU]/mL (ref <5)

## 2015-11-09 LAB — URINE MICROSCOPIC-ADD ON

## 2015-11-09 LAB — LIPASE, BLOOD: LIPASE: 31 U/L (ref 11–51)

## 2015-11-09 MED ORDER — ONDANSETRON 4 MG PO TBDP
4.0000 mg | ORAL_TABLET | Freq: Once | ORAL | Status: AC
  Administered 2015-11-09: 4 mg via ORAL
  Filled 2015-11-09: qty 1

## 2015-11-09 MED ORDER — CEPHALEXIN 250 MG PO CAPS
500.0000 mg | ORAL_CAPSULE | Freq: Once | ORAL | Status: AC
  Administered 2015-11-09: 500 mg via ORAL
  Filled 2015-11-09: qty 2

## 2015-11-09 MED ORDER — ONDANSETRON HCL 4 MG PO TABS: 4.0000 mg | ORAL_TABLET | Freq: Four times a day (QID) | ORAL | Status: DC

## 2015-11-09 MED ORDER — CEPHALEXIN 500 MG PO CAPS: 500.0000 mg | ORAL_CAPSULE | Freq: Two times a day (BID) | ORAL | Status: DC

## 2015-11-09 NOTE — ED Provider Notes (Signed)
CSN: [id number]     Arrival date & time 11/09/15  1517 History   First MD Initiated Contact with Patient 11/09/15 2135     Chief Complaint  Patient presents with  . Fever  . Emesis   HPI Pt started having symptoms 2 weeks ago.  She has had intermittent fever and vomiting.  The pain has been in the upper abdomen. The vomiting is not constant and occurs sometimes depending on what she eats.  She vomited twice today.  No diarrhea.  No dysuria although she is urinating frequently. History reviewed. No pertinent past medical history. History reviewed. No pertinent past surgical history. No family history on file. Social History  Substance Use Topics  . Smoking status: Never Smoker   . Smokeless tobacco: None  . Alcohol Use: Yes     Comment: social   OB History    No data available     Review of Systems  All other systems reviewed and are negative.     Allergies  Review of patient's allergies indicates no known allergies.  Home Medications   Prior to Admission medications   Medication Sig Start Date End Date Taking? Authorizing Provider  metroNIDAZOLE (FLAGYL) 500 MG tablet Take 1 tablet (500 mg total) by mouth 2 (two) times daily. Patient not taking: Reported on 11/09/2015 09/10/14   Oswaldo Conroy, PA-C  ondansetron (ZOFRAN) 4 MG tablet Take 1 tablet (4 mg total) by mouth every 6 (six) hours. Patient not taking: Reported on 11/09/2015 09/10/14   Oswaldo Conroy, PA-C   BP 100/64 mmHg  Pulse 87  Temp(Src) 98.7 F (37.1 C) (Oral)  Resp 16  SpO2 100%  LMP 10/09/2015 Physical Exam  Constitutional: She appears well-developed and well-nourished. No distress.  Morbidly obese   HENT:  Head: Normocephalic and atraumatic.  Right Ear: External ear normal.  Left Ear: External ear normal.  Eyes: Conjunctivae are normal. Right eye exhibits no discharge. Left eye exhibits no discharge. No scleral icterus.  Neck: Neck supple. No tracheal deviation present.  Cardiovascular: Normal  rate, regular rhythm and intact distal pulses.   Pulmonary/Chest: Effort normal and breath sounds normal. No stridor. No respiratory distress. She has no wheezes. She has no rales.  Abdominal: Soft. Bowel sounds are normal. She exhibits no distension. There is no tenderness. There is no rebound and no guarding.  Musculoskeletal: She exhibits no edema or tenderness.  Neurological: She is alert. She has normal strength. No cranial nerve deficit (no facial droop, extraocular movements intact, no slurred speech) or sensory deficit. She exhibits normal muscle tone. She displays no seizure activity. Coordination normal.  Skin: Skin is warm and dry. No rash noted.  Psychiatric: She has a normal mood and affect.  Nursing note and vitals reviewed.   ED Course  Procedures (including critical care time) Labs Review Labs Reviewed  COMPREHENSIVE METABOLIC PANEL - Abnormal; Notable for the following:    Albumin 3.2 (*)    AST 13 (*)    ALT 12 (*)    All other components within normal limits  CBC - Abnormal; Notable for the following:    Hemoglobin 11.6 (*)    MCH 24.8 (*)    All other components within normal limits  URINALYSIS, ROUTINE W REFLEX MICROSCOPIC (NOT AT [facility]) - Abnormal; Notable for the following:    Nitrite POSITIVE (*)    All other components within normal limits  URINE MICROSCOPIC-ADD ON - Abnormal; Notable for the following:    Squamous Epithelial / LPF 6-30 (*)  Bacteria, UA MANY (*)    All other components within normal limits  LIPASE, BLOOD  I-STAT BETA HCG BLOOD, ED ([facility], [facility], [facility] ONLY)     MDM   Final diagnoses:  UTI (lower urinary tract infection)     Abdominal exam is benign.  Possible uti.  Labs otherwise reassuring.  Dc home with abx and zofran for nausea.  Follow up with PCP    Linwood Dibbles, MD 11/09/15 2317

## 2015-11-09 NOTE — Discharge Instructions (Signed)

## 2015-11-09 NOTE — ED Notes (Signed)
Patient verbalized understanding of discharge instructions and denies any further needs or questions at this time. VS stable. Patient ambulatory with steady gait.  

## 2015-11-09 NOTE — ED Notes (Signed)
Pt reports fever, vomiting, intermittent abdominal pain for over 1 week.

## 2019-07-02 DIAGNOSIS — Z713 Dietary counseling and surveillance: Secondary | ICD-10-CM | POA: Insufficient documentation

## 2019-07-02 DIAGNOSIS — Z114 Encounter for screening for human immunodeficiency virus [HIV]: Secondary | ICD-10-CM | POA: Insufficient documentation

## 2019-07-02 DIAGNOSIS — Z7182 Exercise counseling: Secondary | ICD-10-CM | POA: Insufficient documentation

## 2019-07-02 DIAGNOSIS — Z131 Encounter for screening for diabetes mellitus: Secondary | ICD-10-CM | POA: Insufficient documentation

## 2019-07-02 DIAGNOSIS — J301 Allergic rhinitis due to pollen: Secondary | ICD-10-CM | POA: Insufficient documentation

## 2019-07-02 DIAGNOSIS — R519 Headache, unspecified: Secondary | ICD-10-CM | POA: Insufficient documentation

## 2019-07-02 DIAGNOSIS — Z23 Encounter for immunization: Secondary | ICD-10-CM | POA: Insufficient documentation

## 2019-07-02 DIAGNOSIS — Z1159 Encounter for screening for other viral diseases: Secondary | ICD-10-CM | POA: Insufficient documentation

## 2019-11-01 DIAGNOSIS — F321 Major depressive disorder, single episode, moderate: Secondary | ICD-10-CM | POA: Insufficient documentation

## 2019-11-01 DIAGNOSIS — R7989 Other specified abnormal findings of blood chemistry: Secondary | ICD-10-CM | POA: Insufficient documentation

## 2019-11-01 DIAGNOSIS — G43709 Chronic migraine without aura, not intractable, without status migrainosus: Secondary | ICD-10-CM | POA: Insufficient documentation

## 2019-11-01 DIAGNOSIS — Z1331 Encounter for screening for depression: Secondary | ICD-10-CM | POA: Insufficient documentation

## 2019-11-01 DIAGNOSIS — Z3009 Encounter for other general counseling and advice on contraception: Secondary | ICD-10-CM | POA: Insufficient documentation

## 2019-12-17 DIAGNOSIS — N912 Amenorrhea, unspecified: Secondary | ICD-10-CM | POA: Insufficient documentation

## 2020-03-18 DIAGNOSIS — Z124 Encounter for screening for malignant neoplasm of cervix: Secondary | ICD-10-CM | POA: Insufficient documentation

## 2020-03-18 DIAGNOSIS — R350 Frequency of micturition: Secondary | ICD-10-CM | POA: Insufficient documentation

## 2020-08-03 DIAGNOSIS — Z30430 Encounter for insertion of intrauterine contraceptive device: Secondary | ICD-10-CM | POA: Insufficient documentation

## 2020-10-06 ENCOUNTER — Emergency Department (HOSPITAL_COMMUNITY): Payer: Managed Care, Other (non HMO)

## 2020-10-06 ENCOUNTER — Encounter (HOSPITAL_COMMUNITY): Payer: Self-pay

## 2020-10-06 ENCOUNTER — Emergency Department (HOSPITAL_COMMUNITY)
Admission: EM | Admit: 2020-10-06 | Discharge: 2020-10-06 | Disposition: A | Discharge status: Home / Self Care | Payer: Managed Care, Other (non HMO) | Attending: Emergency Medicine | Admitting: Emergency Medicine

## 2020-10-06 DIAGNOSIS — Z20822 Contact with and (suspected) exposure to covid-19: Secondary | ICD-10-CM | POA: Insufficient documentation

## 2020-10-06 DIAGNOSIS — R062 Wheezing: Secondary | ICD-10-CM | POA: Insufficient documentation

## 2020-10-06 DIAGNOSIS — R0602 Shortness of breath: Secondary | ICD-10-CM | POA: Insufficient documentation

## 2020-10-06 LAB — CBC
HCT: 40.2 % (ref 36.0–46.0)
Hemoglobin: 12.8 g/dL (ref 12.0–15.0)
MCH: 25.8 pg — ABNORMAL LOW (ref 26.0–34.0)
MCHC: 31.8 g/dL (ref 30.0–36.0)
MCV: 81 fL (ref 80.0–100.0)
Platelets: 371 10*3/uL (ref 150–400)
RBC: 4.96 MIL/uL (ref 3.87–5.11)
RDW: 14.3 % (ref 11.5–15.5)
WBC: 6.1 10*3/uL (ref 4.0–10.5)
nRBC: 0 % (ref 0.0–0.2)

## 2020-10-06 LAB — BASIC METABOLIC PANEL
Anion gap: 9 (ref 5–15)
BUN: 8 mg/dL (ref 6–20)
CO2: 24 mmol/L (ref 22–32)
Calcium: 8.9 mg/dL (ref 8.9–10.3)
Chloride: 102 mmol/L (ref 98–111)
Creatinine, Ser: 0.73 mg/dL (ref 0.44–1.00)
GFR, Estimated: >60 mL/min (ref >60)
Glucose, Bld: 90 mg/dL (ref 70–99)
Potassium: 4 mmol/L (ref 3.5–5.1)
Sodium: 135 mmol/L (ref 135–145)

## 2020-10-06 LAB — TROPONIN I (HIGH SENSITIVITY)
Troponin I (High Sensitivity): 3 ng/L (ref <18)
Troponin I (High Sensitivity): 3 ng/L (ref <18)

## 2020-10-06 LAB — I-STAT BETA HCG BLOOD, ED (MC, WL, AP ONLY): I-stat hCG, quantitative: <5 m[IU]/mL (ref <5)

## 2020-10-06 MED ORDER — ALBUTEROL SULFATE HFA 108 (90 BASE) MCG/ACT IN AERS
4.0000 | INHALATION_SPRAY | Freq: Once | RESPIRATORY_TRACT | Status: AC
  Administered 2020-10-06: 4 via RESPIRATORY_TRACT
  Filled 2020-10-06: qty 6.7

## 2020-10-06 MED ORDER — PREDNISONE 20 MG PO TABS
60.0000 mg | ORAL_TABLET | Freq: Once | ORAL | Status: AC
  Administered 2020-10-06: 60 mg via ORAL
  Filled 2020-10-06: qty 3

## 2020-10-06 NOTE — Discharge Instructions (Signed)
You were tested for Covid.  Results should return in 6 to 24 hours.  You will receive a phone call if it is positive.  You will not hear anything if it is negative.  Either way, you may check online on MyChart. Regardless, this is likely a viral illness, which should be treated symptomatically.  Use Tylenol or ibuprofen as needed for fevers, headaches, or body aches.  Use cough drops/syrup as needed.  Make sure you stay well-hydrated with water.  Wash your hands frequently to prevent spread of infection.  Use the inhaler every 4 hours while awake for the next 2 days.  After this, use as needed for shortness of breath, chest tightness, or wheezing.   If your covid test is positive, you will need to quarantine for a total of 5 days from symptom onset.  You may end quarantine if you are fever free and your symptoms are improving, however it is extremely important that you wear a mask for an additional 5 days at all times. If you are not fever free your symptoms are not improving, you will need to quarantine until this is the case     Return to the emergency room if you develop chest pain, difficulty breathing, or any new or worsening symptoms.

## 2020-10-06 NOTE — ED Provider Notes (Signed)
[facility] EMERGENCY DEPARTMENT Provider Note   CSN: [id number] Arrival date & time: 10/06/20  1317     History Chief Complaint  Patient presents with  . Shortness of Breath    Becky Porter is a 30 y.o. female presenting for evaluation of shortness of breath.  Patient states she woke up middle of the night feeling anxious having difficulty breathing. It has been persistent since. She feels she is not getting good air in. It is worse when she lays flat and with exertion. She has a mild associated cough. She denies fevers, chills, chest pain, nausea vomiting, abdominal pain. Denies leg pain or swelling. She denies recent travel, surgeries, immobilization, history of cancer, history previous DVT/PE, or hormone use. She reports no medical problems other than depression, takes no medications daily. No history of asthma or COPD. She does not smoke cigarettes. She is fully vaccinated for COVID, no sick contacts.  HPI     History reviewed. No pertinent past medical history.  There are no problems to display for this patient.   History reviewed. No pertinent surgical history.   OB History   No obstetric history on file.     History reviewed. No pertinent family history.  Social History   Tobacco Use  . Smoking status: Never Smoker  Substance Use Topics  . Alcohol use: Yes    Comment: social  . Drug use: No    Home Medications Prior to Admission medications   Medication Sig Start Date End Date Taking? Authorizing Provider  cephALEXin (KEFLEX) 500 MG capsule Take 1 capsule (500 mg total) by mouth 2 (two) times daily. 11/09/15   Linwood Dibbles, MD  metroNIDAZOLE (FLAGYL) 500 MG tablet Take 1 tablet (500 mg total) by mouth 2 (two) times daily. Patient not taking: Reported on 11/09/2015 09/10/14   Oswaldo Conroy, PA-C  ondansetron (ZOFRAN) 4 MG tablet Take 1 tablet (4 mg total) by mouth every 6 (six) hours. 11/09/15   Linwood Dibbles, MD    Allergies    Patient  has no known allergies.  Review of Systems   Review of Systems  Respiratory: Positive for cough and shortness of breath.   All other systems reviewed and are negative.   Physical Exam Updated Vital Signs BP (!) 135/91 (BP Location: Left Wrist)   Pulse 78   Temp 98.5 F (36.9 C) (Oral)   Resp 20   LMP 10/06/2020   SpO2 100%   Physical Exam Vitals and nursing note reviewed.  Constitutional:      General: She is not in acute distress.    Appearance: She is well-developed and well-nourished. She is obese.     Comments: Resting in the bed in no acute distress.  HENT:     Head: Normocephalic and atraumatic.  Eyes:     Extraocular Movements: Extraocular movements intact and EOM normal.     Conjunctiva/sclera: Conjunctivae normal.     Pupils: Pupils are equal, round, and reactive to light.  Cardiovascular:     Rate and Rhythm: Normal rate and regular rhythm.     Pulses: Normal pulses and intact distal pulses.  Pulmonary:     Effort: Pulmonary effort is normal. No respiratory distress.     Breath sounds: Wheezing present.     Comments: Faint expiratory wheezing in upper lobes bilaterally. Speaking in full sentences. Sats stable on room air. Abdominal:     General: There is no distension.     Palpations: Abdomen is soft. There is no  mass.     Tenderness: There is no abdominal tenderness. There is no guarding or rebound.  Musculoskeletal:        General: Normal range of motion.     Cervical back: Normal range of motion and neck supple.     Right lower leg: No edema.     Left lower leg: No edema.     Comments: No obvious edema, although body habitus limits exam  Skin:    General: Skin is warm and dry.  Neurological:     Mental Status: She is alert and oriented to person, place, and time.  Psychiatric:        Mood and Affect: Mood and affect normal.     ED Results / Procedures / Treatments   Labs (all labs ordered are listed, but only abnormal results are displayed) Labs  Reviewed  CBC - Abnormal; Notable for the following components:      Result Value   MCH 25.8 (*)    All other components within normal limits  SARS CORONAVIRUS 2 (TAT 6-24 HRS)  BASIC METABOLIC PANEL  I-STAT BETA HCG BLOOD, ED (MC, WL, AP ONLY)  TROPONIN I (HIGH SENSITIVITY)  TROPONIN I (HIGH SENSITIVITY)    EKG None  Radiology DG Chest Port 1 View  Result Date: 10/06/2020 CLINICAL DATA:  Shortness of breath. EXAM: PORTABLE CHEST 1 VIEW COMPARISON:  No prior. FINDINGS: Mediastinum and hilar structures normal. Low lung volumes. No focal infiltrate. No pleural effusion or pneumothorax. Heart size normal. No acute bony abnormality. IMPRESSION: Low lung volumes. No acute cardiopulmonary disease. Electronically Signed   By: Maisie Fus  Register   On: 10/06/2020 14:08    Procedures Procedures (including critical care time)  Medications Ordered in ED Medications  albuterol (VENTOLIN HFA) 108 (90 Base) MCG/ACT inhaler 4 puff (has no administration in time range)  predniSONE (DELTASONE) tablet 60 mg (has no administration in time range)    ED Course  I have reviewed the triage vital signs and the nursing notes.  Pertinent labs & imaging results that were available during my care of the patient were reviewed by me and considered in my medical decision making (see chart for details).    MDM Rules/Calculators/A&P                          Patient presented for evaluation of shortness of breath. On exam, patient peers nontoxic. She does have faint expiratory wheezing in upper lobes bilaterally. Without a history of asthma, consider viral cause. Will test for COVID. Chest x-ray obtained from triage read interpreted by me, no pneumonia but with worsening effusion. Troponin negative x2, EKG without acute ischemia. Labs obtained in triage interpreted by me, overall reassuring. No leukocytosis. Electrolytes stable. Hemoglobin stable. Discussed findings with patient. Discussed likely viral cause for  illness. Discussed symptomatic management. We will make sure patient is able to ambulate without hypoxia and recheck after albuterol. PERC negative, doubt PE. Exam is not c/w acute chf, and in the setting of a normal cxr, doubt this as cause of sxs.   On reassessment, patient reports improvement of symptoms.  On reevaluation, wheezing has completely resolved.  Patient ambulated in the ED without difficulty or hypoxia.  At this time, patient appears safe for discharge.  Return precautions given.  Patient states she understands and agrees to plan.  Zaina Jenkin was evaluated in Emergency Department on 10/06/2020 for the symptoms described in the history of present illness. She was  evaluated in the context of the global COVID-19 pandemic, which necessitated consideration that the patient might be at risk for infection with the SARS-CoV-2 virus that causes COVID-19. Institutional protocols and algorithms that pertain to the evaluation of patients at risk for COVID-19 are in a state of rapid change based on information released by regulatory bodies including the CDC and federal and state organizations. These policies and algorithms were followed during the patient's care in the ED.   Final Clinical Impression(s) / ED Diagnoses Final diagnoses:  SOB (shortness of breath)    Rx / DC Orders ED Discharge Orders    None       Alveria Apley, PA-C 10/06/20 2058    Benjiman Core, MD 10/06/20 [phone]

## 2020-10-06 NOTE — ED Triage Notes (Signed)
Pt is here today due to sob x3-4day. Pt denies any history, Pt reports having her covid vaccine. Pt reports " I feel like I cant breathe"

## 2020-10-07 LAB — SARS CORONAVIRUS 2 (TAT 6-24 HRS): SARS Coronavirus 2: NEGATIVE

## 2021-01-30 ENCOUNTER — Emergency Department (HOSPITAL_COMMUNITY): Payer: Medicaid Other

## 2021-01-30 ENCOUNTER — Emergency Department (HOSPITAL_COMMUNITY)
Admission: EM | Admit: 2021-01-30 | Discharge: 2021-01-30 | Disposition: A | Discharge status: Home / Self Care | Payer: Medicaid Other | Attending: Emergency Medicine | Admitting: Emergency Medicine

## 2021-01-30 ENCOUNTER — Other Ambulatory Visit: Payer: Self-pay

## 2021-01-30 ENCOUNTER — Encounter (HOSPITAL_COMMUNITY): Payer: Self-pay | Admitting: Emergency Medicine

## 2021-01-30 DIAGNOSIS — J029 Acute pharyngitis, unspecified: Secondary | ICD-10-CM | POA: Insufficient documentation

## 2021-01-30 DIAGNOSIS — Z20822 Contact with and (suspected) exposure to covid-19: Secondary | ICD-10-CM | POA: Insufficient documentation

## 2021-01-30 DIAGNOSIS — J019 Acute sinusitis, unspecified: Secondary | ICD-10-CM | POA: Insufficient documentation

## 2021-01-30 LAB — SARS CORONAVIRUS 2 (TAT 6-24 HRS): SARS Coronavirus 2: NEGATIVE

## 2021-01-30 MED ORDER — METOCLOPRAMIDE HCL 10 MG PO TABS
10.0000 mg | ORAL_TABLET | Freq: Once | ORAL | Status: AC
  Administered 2021-01-30: 10 mg via ORAL
  Filled 2021-01-30: qty 1

## 2021-01-30 MED ORDER — KETOROLAC TROMETHAMINE 15 MG/ML IJ SOLN
30.0000 mg | Freq: Once | INTRAMUSCULAR | Status: AC
  Administered 2021-01-30: 30 mg via INTRAMUSCULAR
  Filled 2021-01-30: qty 2

## 2021-01-30 MED ORDER — ALUM & MAG HYDROXIDE-SIMETH 200-200-20 MG/5ML PO SUSP
30.0000 mL | Freq: Once | ORAL | Status: AC
  Administered 2021-01-30: 30 mL via ORAL
  Filled 2021-01-30: qty 30

## 2021-01-30 MED ORDER — LIDOCAINE VISCOUS HCL 2 % MT SOLN
15.0000 mL | Freq: Once | OROMUCOSAL | Status: AC
  Administered 2021-01-30: 15 mL via ORAL
  Filled 2021-01-30: qty 15

## 2021-01-30 MED ORDER — FLUTICASONE PROPIONATE 50 MCG/ACT NA SUSP: 2.0000 | Freq: Every day | NASAL | 0 refills | Status: DC

## 2021-01-30 NOTE — ED Triage Notes (Signed)
Pt reports cough, runny nose, and sneezing that started last night. Also reports a fever last night. States that she took a benadryl today without relief. Afebrile in triage.

## 2021-01-30 NOTE — Discharge Instructions (Signed)
Your chest x-ray today is reassuring and does not show signs of pneumonia.  You have a COVID test which is pending.  Follow-up on these results through MyChart.  We recommend daily Zyrtec or Claritin for congestion management in addition to Flonase as prescribed and saline sinus sprays or rinses.  Take 600 mg ibuprofen every 6 hours for management of fever, body aches, headache.  You may continue use of other over-the-counter medications for symptom control.  Follow-up with a primary doctor.

## 2021-01-30 NOTE — ED Provider Notes (Signed)
[facility]-EMERGENCY DEPT Provider Note   CSN: [id number] Arrival date & time: 01/30/21  0000     History Chief Complaint  Patient presents with  . Cough    Becky Porter is a 30 y.o. female.  30 year old female presents to the emergency department for URI symptoms x2 days.  Has had a frontal headache with nasal congestion, rhinorrhea and sneezing.  Also reports a sore throat, nondescript cough, some shortness of breath.  Had a fever up to 100 F last night which spontaneously resolved.  Took Benadryl today for symptoms without relief.  Initially felt that her congestion and runny nose are secondary to allergies.  She has no reported sick contacts.  Denies vomiting, hemoptysis, syncope.  Has been vaccinated for COVID, but has never received a booster shot.       History reviewed. No pertinent past medical history.  There are no problems to display for this patient.   History reviewed. No pertinent surgical history.   OB History   No obstetric history on file.     History reviewed. No pertinent family history.  Social History   Tobacco Use  . Smoking status: Never Smoker  Substance Use Topics  . Alcohol use: Yes    Comment: social  . Drug use: No    Home Medications Prior to Admission medications   Medication Sig Start Date End Date Taking? Authorizing Provider  fluticasone (FLONASE) 50 MCG/ACT nasal spray Place 2 sprays into both nostrils daily for 5 days. After 5 days, use 1 spray in each nostril daily as needed for persistent congestion. 01/30/21 02/04/21 Yes Antony Madura, PA-C  escitalopram (LEXAPRO) 20 MG tablet Take 20 mg by mouth as needed (depression). 12/16/19   [provider]  ondansetron (ZOFRAN) 4 MG tablet Take 1 tablet (4 mg total) by mouth every 6 (six) hours. Patient not taking: No sig reported 11/09/15   Linwood Dibbles, MD    Allergies    Patient has no known allergies.  Review of Systems   Review of Systems  Ten  systems reviewed and are negative for acute change, except as noted in the HPI.    Physical Exam Updated Vital Signs BP (!) 141/99 (BP Location: Left Arm)   Pulse 86   Temp 99.1 F (37.3 C) (Oral)   Resp 18   SpO2 100%   Physical Exam Vitals and nursing note reviewed.  Constitutional:      General: She is not in acute distress.    Appearance: She is well-developed. She is not diaphoretic.     Comments: Nontoxic appearing and in NAD.  HENT:     Head: Normocephalic and atraumatic.     Right Ear: External ear normal.     Left Ear: External ear normal.     Nose: Congestion present. No rhinorrhea.     Comments: Audible congestion    Mouth/Throat:     Comments: Mild posterior oropharyngeal erythema without exudates, edema.  Uvula midline.  No tripoding.  Tolerating secretions. Eyes:     General: No scleral icterus.    Conjunctiva/sclera: Conjunctivae normal.  Cardiovascular:     Rate and Rhythm: Normal rate and regular rhythm.     Pulses: Normal pulses.  Pulmonary:     Effort: Pulmonary effort is normal. No respiratory distress.     Breath sounds: No stridor. No wheezing, rhonchi or rales.     Comments: Lungs grossly clear to auscultation.  No cough appreciated. Musculoskeletal:  General: Normal range of motion.     Cervical back: Normal range of motion.  Skin:    General: Skin is warm and dry.     Coloration: Skin is not pale.     Findings: No erythema or rash.  Neurological:     Mental Status: She is alert and oriented to person, place, and time.     Coordination: Coordination normal.  Psychiatric:        Behavior: Behavior normal.     ED Results / Procedures / Treatments   Labs (all labs ordered are listed, but only abnormal results are displayed) Labs Reviewed  SARS CORONAVIRUS 2 (TAT 6-24 HRS)    EKG None  Radiology DG Chest 2 View  Result Date: 01/30/2021 CLINICAL DATA:  30 year old female with shortness of breath and cough. EXAM: CHEST - 2 VIEW  COMPARISON:  Chest radiograph dated 10/06/2020 FINDINGS: The heart size and mediastinal contours are within normal limits. Both lungs are clear. The visualized skeletal structures are unremarkable. IMPRESSION: No active cardiopulmonary disease. Electronically Signed   By: Elgie Collard M.D.   On: 01/30/2021 01:44    Procedures Procedures   Medications Ordered in ED Medications  ketorolac (TORADOL) 15 MG/ML injection 30 mg (30 mg Intramuscular Given 01/30/21 0145)  metoCLOPramide (REGLAN) tablet 10 mg (10 mg Oral Given 01/30/21 0146)  alum & mag hydroxide-simeth (MAALOX/MYLANTA) 200-200-20 MG/5ML suspension 30 mL (30 mLs Oral Given 01/30/21 0156)    And  lidocaine (XYLOCAINE) 2 % viscous mouth solution 15 mL (15 mLs Oral Given 01/30/21 0156)    ED Course  I have reviewed the triage vital signs and the nursing notes.  Pertinent labs & imaging results that were available during my care of the patient were reviewed by me and considered in my medical decision making (see chart for details).    MDM Rules/Calculators/A&P                          Patient complaining of symptoms of sinusitis.  Mild to moderate symptoms of clear/yellow nasal discharge/congestion and scratchy throat with cough for less than 10 days.  Patient is afebrile.  No concern for acute bacterial rhinosinusitis; likely viral in nature.  COVID test pending.  Patient discharged with symptomatic treatment.  Patient instructions given for warm saline nasal washes.  Recommendations for follow-up with primary care physician.  Return precautions discussed and provided. Patient discharged in stable condition with no unaddressed concerns.  Becky Porter was evaluated in Emergency Department on 01/30/2021 for the symptoms described in the history of present illness. She was evaluated in the context of the global COVID-19 pandemic, which necessitated consideration that the patient might be at risk for infection with the SARS-CoV-2 virus that  causes COVID-19. Institutional protocols and algorithms that pertain to the evaluation of patients at risk for COVID-19 are in a state of rapid change based on information released by regulatory bodies including the CDC and federal and state organizations. These policies and algorithms were followed during the patient's care in the ED.   Final Clinical Impression(s) / ED Diagnoses Final diagnoses:  Acute non-recurrent sinusitis, unspecified location    Rx / DC Orders ED Discharge Orders         Ordered    fluticasone (FLONASE) 50 MCG/ACT nasal spray  Daily        01/30/21 0159           Antony Madura, PA-C 01/30/21 0200  Molpus, Jonny Ruiz, MD 01/30/21 [phone]

## 2021-07-07 DIAGNOSIS — Z30011 Encounter for initial prescription of contraceptive pills: Secondary | ICD-10-CM | POA: Diagnosis not present

## 2021-07-07 DIAGNOSIS — Z114 Encounter for screening for human immunodeficiency virus [HIV]: Secondary | ICD-10-CM | POA: Diagnosis not present

## 2021-10-23 IMAGING — CR DG CHEST 2V
1 series · 1 of 1 positions shown · non-contrast
Comparison: Chest radiograph dated 10/06/2020

CLINICAL DATA: 29-year-old female with shortness of breath and
cough.

EXAM:
CHEST - 2 VIEW

[w chest lat]
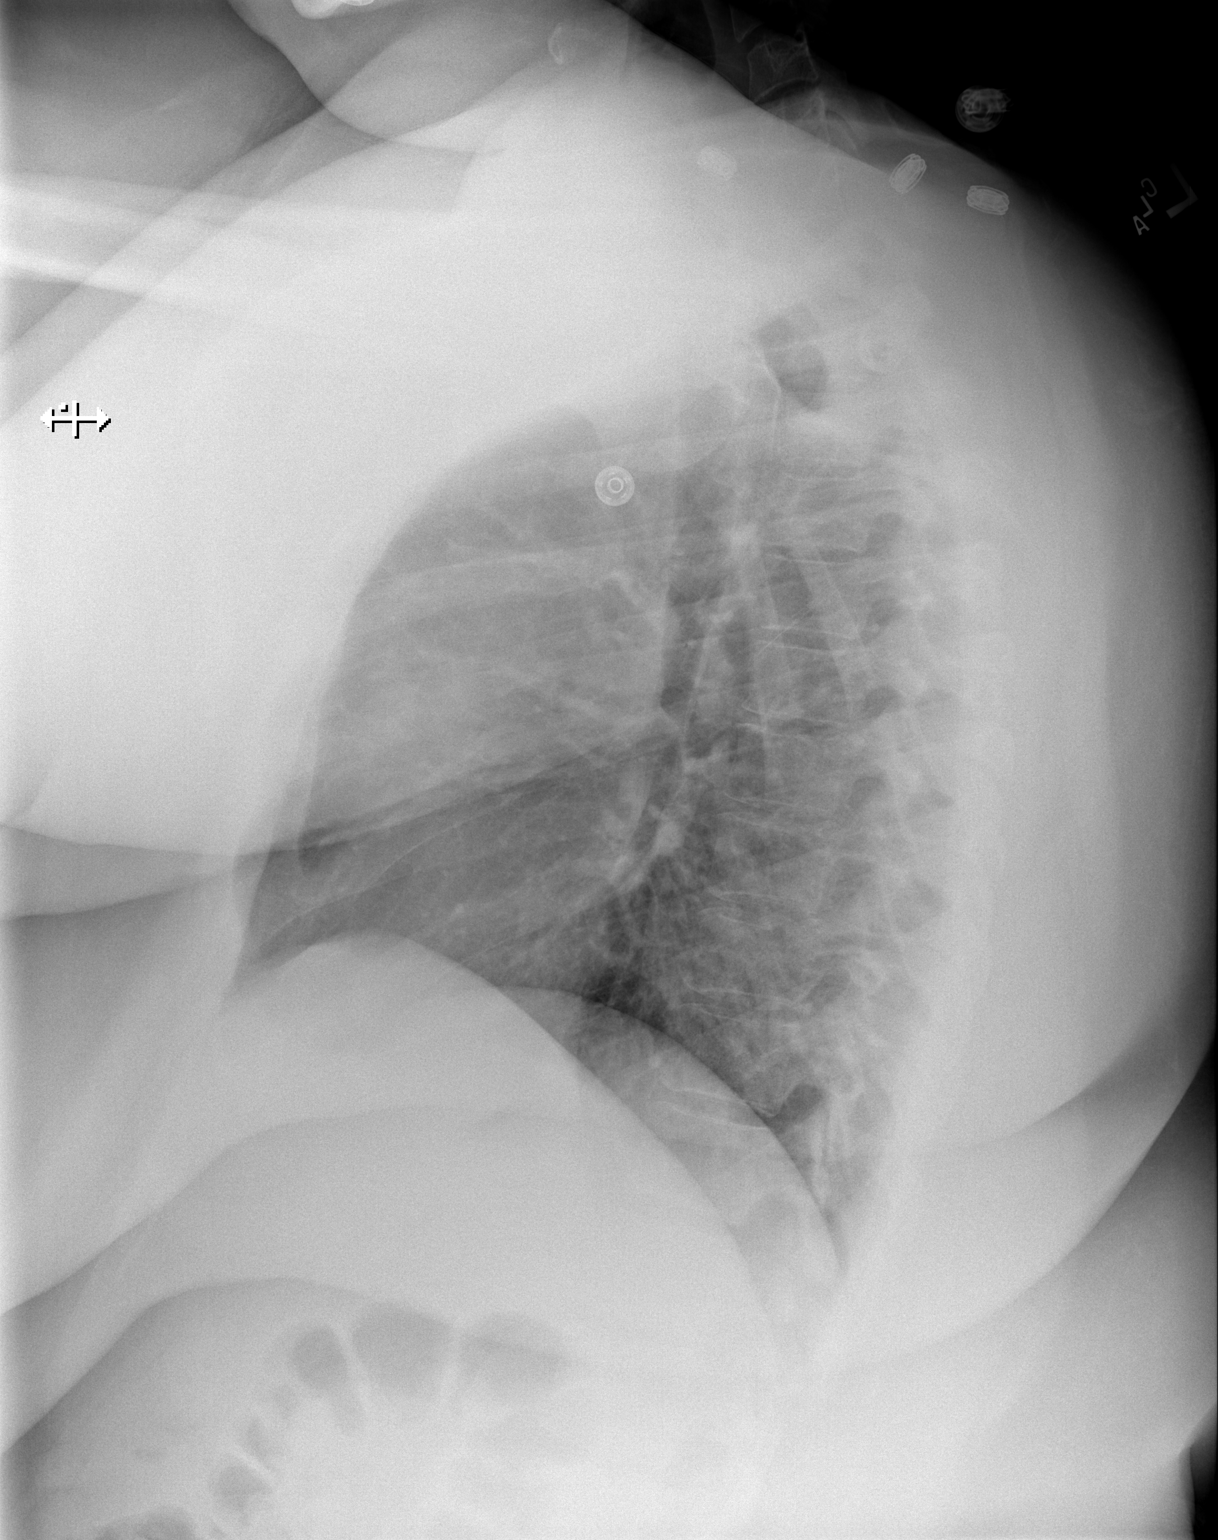

[1 of 1 positions shown; findings below may reference images not displayed]

FINDINGS: The heart size and mediastinal contours are within normal limits.
Both lungs are clear. The visualized skeletal structures are
unremarkable.
IMPRESSION: No active cardiopulmonary disease.

## 2022-09-15 DIAGNOSIS — Z3201 Encounter for pregnancy test, result positive: Secondary | ICD-10-CM | POA: Diagnosis not present

## 2022-09-15 DIAGNOSIS — R42 Dizziness and giddiness: Secondary | ICD-10-CM | POA: Diagnosis not present

## 2022-09-15 DIAGNOSIS — R112 Nausea with vomiting, unspecified: Secondary | ICD-10-CM | POA: Diagnosis not present

## 2022-09-15 DIAGNOSIS — R110 Nausea: Secondary | ICD-10-CM | POA: Diagnosis not present

## 2022-09-26 NOTE — L&D Delivery Note (Addendum)
OB/GYN Faculty Practice Delivery Note  Becky Porter is a 32 y.o. G1P0 s/p SVD at [redacted]w[redacted]d. She was admitted for spontaneous labor.   ROM: 23h 13m with thick meconium fluid GBS Status:  Positive/-- (07/09 1137) ampicillin ppx Maximum Maternal Temperature: 98.9 F  Labor Progress: Initial SVE: 6/90/-2. She then progressed to complete.   Delivery Date/Time: 04/15/2023 at 1549   Delivery: Called to room and patient was complete and pushing. Head delivered ROA. No nuchal cord present. Infant's head was delivered and shoulder dystocia was identified. Patient was laid back flat and McRoberts was performed. Delivery of anterior shoulder was completed with modified woodscrew maneuver via sweeping anterior axilla. Infant with spontaneous cry, placed on mother's abdomen, dried and stimulated. Cord clamped x 2 and baby brought to warmer for further intervention. Cord blood drawn. Placenta delivered spontaneously with gentle cord traction. Fundus firm with massage and Pitocin. Labia, perineum, vagina, and cervix inspected with minimal abrasions noted, no repair required.  Baby Weight: pending  Placenta: 3 vessel, intact. Sent to L&D. Complications: Shoulder dystocia Lacerations: None EBL: 117 mL Analgesia: Epidural   Unable to place postplacental IUD as >10 minutes had passed following delivery. Patient will need outpatient IUD placement scheduled.   Infant:  APGAR (1 MIN): 5  APGAR (5 MINS): 8   Glee Arvin, MD FM PGY-3 04/15/2023, 4:20 PM  Fellow ATTESTATION  I was present and gloved for this delivery and agree with the above documentation in the resident's note except as below.  Celedonio Savage, MD [facility] (Faculty Practice) 04/15/2023, 4:50 PM

## 2022-09-30 ENCOUNTER — Inpatient Hospital Stay (HOSPITAL_COMMUNITY)
Admission: AD | Admit: 2022-09-30 | Discharge: 2022-09-30 | Disposition: A | Discharge status: Home / Self Care | Payer: Medicaid Other | Attending: Family Medicine | Admitting: Family Medicine

## 2022-09-30 ENCOUNTER — Encounter (HOSPITAL_COMMUNITY): Payer: Self-pay

## 2022-09-30 ENCOUNTER — Inpatient Hospital Stay (HOSPITAL_COMMUNITY): Payer: Medicaid Other

## 2022-09-30 DIAGNOSIS — O3481 Maternal care for other abnormalities of pelvic organs, first trimester: Secondary | ICD-10-CM | POA: Insufficient documentation

## 2022-09-30 DIAGNOSIS — O26891 Other specified pregnancy related conditions, first trimester: Secondary | ICD-10-CM | POA: Diagnosis not present

## 2022-09-30 DIAGNOSIS — R519 Headache, unspecified: Secondary | ICD-10-CM | POA: Insufficient documentation

## 2022-09-30 DIAGNOSIS — R109 Unspecified abdominal pain: Secondary | ICD-10-CM

## 2022-09-30 DIAGNOSIS — N8311 Corpus luteum cyst of right ovary: Secondary | ICD-10-CM | POA: Insufficient documentation

## 2022-09-30 DIAGNOSIS — R102 Pelvic and perineal pain: Secondary | ICD-10-CM | POA: Diagnosis not present

## 2022-09-30 DIAGNOSIS — Z3A10 10 weeks gestation of pregnancy: Secondary | ICD-10-CM | POA: Insufficient documentation

## 2022-09-30 DIAGNOSIS — N831 Corpus luteum cyst of ovary, unspecified side: Secondary | ICD-10-CM

## 2022-09-30 DIAGNOSIS — Z3A09 9 weeks gestation of pregnancy: Secondary | ICD-10-CM | POA: Diagnosis not present

## 2022-09-30 DIAGNOSIS — Z3491 Encounter for supervision of normal pregnancy, unspecified, first trimester: Secondary | ICD-10-CM

## 2022-09-30 HISTORY — DX: Other specified health status: Z78.9

## 2022-09-30 LAB — URINALYSIS, ROUTINE W REFLEX MICROSCOPIC
Bilirubin Urine: NEGATIVE
Glucose, UA: NEGATIVE mg/dL
Hgb urine dipstick: NEGATIVE
Ketones, ur: NEGATIVE mg/dL
Leukocytes,Ua: NEGATIVE
Nitrite: NEGATIVE
Protein, ur: NEGATIVE mg/dL
Specific Gravity, Urine: 1.019 (ref 1.005–1.030)
pH: 8 (ref 5.0–8.0)

## 2022-09-30 LAB — CBC
HCT: 36.2 % (ref 36.0–46.0)
Hemoglobin: 12 g/dL (ref 12.0–15.0)
MCH: 25.6 pg — ABNORMAL LOW (ref 26.0–34.0)
MCHC: 33.1 g/dL (ref 30.0–36.0)
MCV: 77.2 fL — ABNORMAL LOW (ref 80.0–100.0)
Platelets: 320 10*3/uL (ref 150–400)
RBC: 4.69 MIL/uL (ref 3.87–5.11)
RDW: 13.9 % (ref 11.5–15.5)
WBC: 7.9 10*3/uL (ref 4.0–10.5)
nRBC: 0 % (ref 0.0–0.2)

## 2022-09-30 LAB — ABO/RH
ABO/RH(D): A NEG
Antibody Screen: NEGATIVE

## 2022-09-30 LAB — HCG, QUANTITATIVE, PREGNANCY: hCG, Beta Chain, Quant, S: 70007 m[IU]/mL — ABNORMAL HIGH (ref <5)

## 2022-09-30 MED ORDER — ACETAMINOPHEN-CAFFEINE 500-65 MG PO TABS
2.0000 | ORAL_TABLET | Freq: Once | ORAL | Status: AC
Start: 2022-09-30 — End: 2022-09-30
  Administered 2022-09-30: 2 via ORAL
  Filled 2022-09-30: qty 2

## 2022-09-30 MED ORDER — EXCEDRIN MIGRAINE 250-250-65 MG PO TABS: 2.0000 | ORAL_TABLET | Freq: Four times a day (QID) | ORAL | 0 refills | Status: DC | PRN

## 2022-09-30 NOTE — MAU Provider Note (Signed)
Chief Complaint:  Abdominal Pain   Event Date/Time   First Provider Initiated Contact with Patient 09/30/22 1323     HPI: Becky Porter is a 32 y.o. G1P0 at [redacted]w[redacted]d who presents to maternity admissions reporting lower abdominal cramping but no vaginal bleeding as well as a headache. Has a history of migraines and was prescribed a medication but was cautioned against using it in pregnancy. Has not tried anything else for pain.   Pregnancy Course: Planning [facility], has new OB intake 10/18/22 and new OB visit on 10/25/22  Past Medical History:  Diagnosis Date   Medical history non-contributory    OB History  Gravida Para Term Preterm AB Living  1            SAB IAB Ectopic Multiple Live Births               # Outcome Date GA Lbr Len/2nd Weight Sex Delivery Anes PTL Lv  1 Current            History reviewed. No pertinent surgical history. History reviewed. No pertinent family history. Social History   Tobacco Use   Smoking status: Never  Substance Use Topics   Alcohol use: Not Currently    Comment: social   Drug use: No   No Known Allergies No medications prior to admission.    I have reviewed patient's Past Medical Hx, Surgical Hx, Family Hx, Social Hx, medications and allergies.   ROS:  Pertinent items noted in HPI and remainder of comprehensive ROS otherwise negative.   Physical Exam  Patient Vitals for the past 24 hrs:  BP Temp Temp src Pulse Resp SpO2 Height Weight  09/30/22 1609 (!) 137/94 -- -- 90 -- -- -- --  09/30/22 1404 134/70 -- -- 93 -- -- -- --  09/30/22 1225 (!) 148/88 98.2 F (36.8 C) Oral (!) 104 15 99 % 5\' 5"  (1.651 m) (!) 360 lb 4.8 oz (163.4 kg)   Constitutional: Well-developed, obese female in no acute distress.  Cardiovascular: normal rate & rhythm, warm and well-perfused Respiratory: normal effort, no problems with respiration noted GI: Abd soft, non-tender MS: Extremities nontender, no edema, normal ROM Neurologic: Alert and oriented x  4.  GU: no CVA tenderness Pelvic: exam deferred   Labs: Results for orders placed or performed during the hospital encounter of 09/30/22 (from the past 24 hour(s))  Urinalysis, Routine w reflex microscopic Urine, Clean Catch     Status: None   Collection Time: 09/30/22 12:40 PM  Result Value Ref Range   Color, Urine YELLOW YELLOW   APPearance CLEAR CLEAR   Specific Gravity, Urine 1.019 1.005 - 1.030   pH 8.0 5.0 - 8.0   Glucose, UA NEGATIVE NEGATIVE mg/dL   Hgb urine dipstick NEGATIVE NEGATIVE   Bilirubin Urine NEGATIVE NEGATIVE   Ketones, ur NEGATIVE NEGATIVE mg/dL   Protein, ur NEGATIVE NEGATIVE mg/dL   Nitrite NEGATIVE NEGATIVE   Leukocytes,Ua NEGATIVE NEGATIVE  CBC     Status: Abnormal   Collection Time: 09/30/22  1:43 PM  Result Value Ref Range   WBC 7.9 4.0 - 10.5 K/uL   RBC 4.69 3.87 - 5.11 MIL/uL   Hemoglobin 12.0 12.0 - 15.0 g/dL   HCT 36.2 36.0 - 46.0 %   MCV 77.2 (L) 80.0 - 100.0 fL   MCH 25.6 (L) 26.0 - 34.0 pg   MCHC 33.1 30.0 - 36.0 g/dL   RDW 13.9 11.5 - 15.5 %   Platelets 320 150 -  400 K/uL   nRBC 0.0 0.0 - 0.2 %  ABO/Rh     Status: None   Collection Time: 09/30/22  1:43 PM  Result Value Ref Range   ABO/RH(D) A NEG    Antibody Screen      NEG Performed at [facility] [address], [place], [place] [address]   hCG, quantitative, pregnancy     Status: Abnormal   Collection Time: 09/30/22  1:43 PM  Result Value Ref Range   hCG, Beta Chain, Quant, S 70,007 (H) <5 mIU/mL   Imaging:  US OB Comp Less 14 Wks  Result Date: 09/30/2022 CLINICAL DATA:  Abdominal cramping. EXAM: OBSTETRIC <14 WK ULTRASOUND TECHNIQUE: Transabdominal ultrasound was performed for evaluation of the gestation as well as the maternal uterus and adnexal regions. COMPARISON:  None Available. FINDINGS: Intrauterine gestational sac: Single Yolk sac:  Not Visualized. Embryo:  Visualized. Cardiac Activity: Visualized. Heart Rate: 164 bpm CRL:   26.1 mm   9 w 3 d                  [place]  EDC: 05/02/2023 Subchorionic hemorrhage:  None visualized. Maternal uterus/adnexae: Bilateral ovaries appear within normal limits. Corpus luteum cyst noted in the right ovary. No pelvic free fluid. IMPRESSION: 1. Single live intrauterine gestation measuring 9 weeks 3 days by crown-rump length. 2. No acute abnormality. Electronically Signed   By: Ronney Asters M.D.   On: 09/30/2022 15:07    MAU Course: Orders Placed This Encounter  Procedures   US OB Comp Less 14 Wks   Urinalysis, Routine w reflex microscopic Urine, Clean Catch   CBC   hCG, quantitative, pregnancy   ABO/Rh   Discharge patient   Meds ordered this encounter  Medications   acetaminophen-caffeine (EXCEDRIN TENSION HEADACHE) 500-65 MG per tablet 2 tablet   aspirin-acetaminophen-caffeine (EXCEDRIN MIGRAINE) 250-250-65 MG tablet    Sig: Take 2 tablets by mouth every 6 (six) hours as needed for headache.    Dispense:  30 tablet    Refill:  0    Order Specific Question:   Supervising Provider    Answer:   Donnamae Jude [[id number]]   Course -  Excedrin given prior to ultrasound.  Results reviewed, all normal with IUP with cardiac activity. Headache completely relieved by excedrin.  MDM: Low  Assessment: 1. Abdominal pain in pregnancy, first trimester   2. Corpus luteum cyst   3. Presence of fetal heart activity in first trimester   4. [redacted] weeks gestation of pregnancy   5. Pregnancy headache in first trimester    Plan: Discharge home in stable condition with first trimester precautions.     Follow-up [place] Follow up.   Specialty: Obstetrics and Gynecology Why: as scheduled for ongoing prenatal care Contact information: [address]  [place] [address] [phone]                Allergies as of 09/30/2022   No Known Allergies      Medication List     STOP taking these medications    ondansetron 4 MG  tablet Commonly known as: ZOFRAN       TAKE these medications    escitalopram 20 MG tablet Commonly known as: LEXAPRO Take 20 mg by mouth as needed (depression).   Excedrin Migraine 250-250-65 MG tablet Generic drug: aspirin-acetaminophen-caffeine Take 2 tablets by mouth every 6 (six) hours as  needed for headache.   fluticasone 50 MCG/ACT nasal spray Commonly known as: FLONASE Place 2 sprays into both nostrils daily for 5 days. After 5 days, use 1 spray in each nostril daily as needed for persistent congestion.        Edd Arbour, CNM, MSN, IBCLC Certified Nurse Midwife, [facility]

## 2022-09-30 NOTE — MAU Note (Addendum)
...  Becky Porter is a 32 y.o. at [redacted]w[redacted]d here in MAU reporting: Intermittent right lower abdominal cramping since yesterday. She reports she has also been experiencing migraines and she usually takes medication outside of pregnancy for her HA's but has not taken anything. Currently has a posterior HA that started two days ago. Endorses nausea since finding out she was pregnant. Denies vaginal itching, vaginal odors, and vaginal discharge. Denies VB.  Was ordered Zofran. She reports she has not taken any since the last week of December. She did not feel as if it helped.  Upcoming appointments: Video Appointment on 23rd First OB Appointment on 30th  LMP: 07/18/2022 - irregular periods - negative at home test in the beginning of December. Positive at home test the week before Christmas.  Onset of complaint: Yesterday - cramps, nausea -  Pain score: 4/10 right lower abdominal pain 4/10 HA - posterior  FHT: RN did not attempt to doppler FHT with negative pregnancy test in Early December. Lab orders placed from triage: UA

## 2022-10-07 ENCOUNTER — Encounter: Payer: Self-pay | Admitting: *Deleted

## 2022-10-12 LAB — PREGNANCY, URINE: Pregnancy Test, Urine: POSITIVE

## 2022-10-18 ENCOUNTER — Telehealth (INDEPENDENT_AMBULATORY_CARE_PROVIDER_SITE_OTHER): Payer: Medicaid Other

## 2022-10-18 DIAGNOSIS — Z3689 Encounter for other specified antenatal screening: Secondary | ICD-10-CM

## 2022-10-18 DIAGNOSIS — O0990 Supervision of high risk pregnancy, unspecified, unspecified trimester: Secondary | ICD-10-CM | POA: Insufficient documentation

## 2022-10-18 DIAGNOSIS — Z3480 Encounter for supervision of other normal pregnancy, unspecified trimester: Secondary | ICD-10-CM

## 2022-10-18 MED ORDER — BLOOD PRESSURE MONITORING DEVI: 1.0000 | 0 refills | Status: DC

## 2022-10-18 NOTE — Progress Notes (Signed)
New OB Intake  I connected with Becky Porter  on 10/18/22 at 10:15 AM EST by MyChart Video Visit and verified that I am speaking with the correct person using two identifiers. Nurse is located at [facility] and pt is located at home.  I discussed the limitations, risks, security and privacy concerns of performing an evaluation and management service by telephone and the availability of in person appointments. I also discussed with the patient that there may be a patient responsible charge related to this service. The patient expressed understanding and agreed to proceed.  I explained I am completing New OB Intake today. We discussed EDD of 04/24/23 that is based on LMP of 07/18/22. Pt is G1/P0. I reviewed her allergies, medications, Medical/Surgical/OB history, and appropriate screenings. I informed her of [facility] services. [facility] information placed in AVS. Based on history, this is a high risk pregnancy.  There are no problems to display for this patient.   Concerns addressed today  Delivery Plans Plans to deliver at [facility]. Patient given information for [facility] Healthy Baby website for more information about Women's and [place]. Patient is not interested in water birth. Offered upcoming OB visit with CNM to discuss further.  MyChart/Babyscripts MyChart access verified. I explained pt will have some visits in office and some virtually. Babyscripts instructions given and order placed. Patient verifies receipt of registration text/e-mail. Account successfully created and app downloaded.  Blood Pressure Cuff/Weight Scale Blood pressure cuff ordered for patient to pick-up from [employer]. Explained after first prenatal appt pt will check weekly and document in 16. Patient does have weight scale.  Anatomy US Explained first scheduled [place] will be around 19 weeks. Anatomy US scheduled for 11/28/22 at 0945a. Pt notified to arrive at 0930a.  Labs Discussed Johnsie Cancel genetic screening  with patient. Would like both Panorama and Horizon drawn at new OB visit. Routine prenatal labs needed.  COVID Vaccine Patient has had COVID vaccine.   Is patient a CenteringPregnancy candidate?  Declined Declined due to Enrolled in [facility] Not a candidate due to  NA If accepted,    Is patient a Mom+Baby Combined Care candidate?  Accepted   If accepted, Mom+Baby staff notified  Social Determinants of Health Food Insecurity: Patient denies food insecurity. WIC Referral: Patient is interested in referral to [facility].  Transportation: Patient denies transportation needs. Childcare: Discussed no children allowed at ultrasound appointments. Offered childcare services; patient declines childcare services at this time.  First visit review I reviewed new OB appt with patient. I explained they will have a provider visit that includes . Explained pt will be seen by Dr.Eckstat at first visit; encounter routed to appropriate provider. Explained that patient will be seen by pregnancy navigator following visit with provider.   Bethanne Ginger, CMA 10/18/2022  10:17 AM

## 2022-10-22 ENCOUNTER — Encounter: Payer: Self-pay | Admitting: Obstetrics & Gynecology

## 2022-10-22 ENCOUNTER — Other Ambulatory Visit: Payer: Self-pay | Admitting: Obstetrics & Gynecology

## 2022-10-22 DIAGNOSIS — I10 Essential (primary) hypertension: Secondary | ICD-10-CM | POA: Insufficient documentation

## 2022-10-22 DIAGNOSIS — Z6841 Body Mass Index (BMI) 40.0 and over, adult: Secondary | ICD-10-CM | POA: Insufficient documentation

## 2022-10-22 DIAGNOSIS — O10919 Unspecified pre-existing hypertension complicating pregnancy, unspecified trimester: Secondary | ICD-10-CM

## 2022-10-22 MED ORDER — NIFEDIPINE ER OSMOTIC RELEASE 30 MG PO TB24: 30.0000 mg | ORAL_TABLET | Freq: Every day | ORAL | 2 refills | Status: DC

## 2022-10-22 MED ORDER — ASPIRIN 81 MG PO TBEC: 81.0000 mg | DELAYED_RELEASE_TABLET | Freq: Every day | ORAL | 2 refills | Status: DC

## 2022-10-22 NOTE — Progress Notes (Signed)
Patient has multiple elevated BPs, was started on Nifedipine 30 mg po daily. ASA 81 mg po daily prescribed for preeclampsia prophylaxis. Referral made to [facility] given this diagnosis of CHTN in pregnancy, patient also has BMI 62.  Will continue to monitor BP closely and adjust medication as needed.   Verita Schneiders, MD

## 2022-10-23 ENCOUNTER — Encounter: Payer: Self-pay | Admitting: Obstetrics & Gynecology

## 2022-10-25 ENCOUNTER — Other Ambulatory Visit (HOSPITAL_COMMUNITY)
Admission: RE | Admit: 2022-10-25 | Discharge: 2022-10-25 | Disposition: A | Discharge status: Home / Self Care | Payer: Medicaid Other | Source: Ambulatory Visit | Attending: Certified Nurse Midwife | Admitting: Certified Nurse Midwife

## 2022-10-25 ENCOUNTER — Encounter: Payer: Self-pay | Admitting: Family Medicine

## 2022-10-25 ENCOUNTER — Encounter: Payer: Self-pay | Admitting: Certified Nurse Midwife

## 2022-10-25 ENCOUNTER — Ambulatory Visit (INDEPENDENT_AMBULATORY_CARE_PROVIDER_SITE_OTHER): Payer: Medicaid Other | Admitting: Family Medicine

## 2022-10-25 VITALS — BP 131/80 | HR 109 | Wt 362.0 lb

## 2022-10-25 DIAGNOSIS — O10919 Unspecified pre-existing hypertension complicating pregnancy, unspecified trimester: Secondary | ICD-10-CM | POA: Diagnosis not present

## 2022-10-25 DIAGNOSIS — Z124 Encounter for screening for malignant neoplasm of cervix: Secondary | ICD-10-CM | POA: Diagnosis not present

## 2022-10-25 DIAGNOSIS — O26899 Other specified pregnancy related conditions, unspecified trimester: Secondary | ICD-10-CM | POA: Insufficient documentation

## 2022-10-25 DIAGNOSIS — O0992 Supervision of high risk pregnancy, unspecified, second trimester: Secondary | ICD-10-CM | POA: Diagnosis not present

## 2022-10-25 DIAGNOSIS — O99212 Obesity complicating pregnancy, second trimester: Secondary | ICD-10-CM

## 2022-10-25 DIAGNOSIS — O26892 Other specified pregnancy related conditions, second trimester: Secondary | ICD-10-CM | POA: Diagnosis not present

## 2022-10-25 DIAGNOSIS — O10912 Unspecified pre-existing hypertension complicating pregnancy, second trimester: Secondary | ICD-10-CM

## 2022-10-25 DIAGNOSIS — O0990 Supervision of high risk pregnancy, unspecified, unspecified trimester: Secondary | ICD-10-CM

## 2022-10-25 DIAGNOSIS — Z3A14 14 weeks gestation of pregnancy: Secondary | ICD-10-CM

## 2022-10-25 DIAGNOSIS — Z6791 Unspecified blood type, Rh negative: Secondary | ICD-10-CM | POA: Diagnosis not present

## 2022-10-25 NOTE — Progress Notes (Signed)
Informal bedside ultrasound performed to assess FHR, FHR 143bpm. Fetal movement visualized

## 2022-10-25 NOTE — Progress Notes (Signed)
Subjective:   Becky Porter is a 32 y.o. G1P0 at [redacted]w[redacted]d by early ultrasound being seen today for her first obstetrical visit.  Her obstetrical history is significant for  chronic hypertension, Rh neg . Patient does intend to breast feed. Pregnancy history fully reviewed.  Patient reports no complaints.  HISTORY: OB History  Gravida Para Term Preterm AB Living  1 0 0 0 0 0  SAB IAB Ectopic Multiple Live Births  0 0 0 0 0    # Outcome Date GA Lbr Len/2nd Weight Sex Delivery Anes PTL Lv  1 Current              Last pap smear: No results found for: "DIAGPAP", "HPV", "[place]" 03/18/2020 - NILM  Past Medical History:  Diagnosis Date   Medical history non-contributory    History reviewed. No pertinent surgical history. History reviewed. No pertinent family history. Social History   Tobacco Use   Smoking status: Never  Substance Use Topics   Alcohol use: Not Currently    Comment: social   Drug use: No   No Known Allergies Current Outpatient Medications on File Prior to Visit  Medication Sig Dispense Refill   aspirin EC 81 MG tablet Take 1 tablet (81 mg total) by mouth daily. Start taking when you are [redacted] weeks pregnant for rest of pregnancy for prevention of preeclampsia 300 tablet 2   aspirin-acetaminophen-caffeine (EXCEDRIN MIGRAINE) 250-250-65 MG tablet Take 2 tablets by mouth every 6 (six) hours as needed for headache. (Patient not taking: Reported on 10/18/2022) 30 tablet 0   escitalopram (LEXAPRO) 20 MG tablet Take 20 mg by mouth as needed (depression). (Patient not taking: Reported on 10/18/2022)     NIFEdipine (PROCARDIA-XL/NIFEDICAL-XL) 30 MG 24 hr tablet Take 1 tablet (30 mg total) by mouth daily. 30 tablet 2   prenatal vitamin w/FE, FA (PRENATAL 1 + 1) 27-1 MG TABS tablet Take 1 tablet by mouth daily at 12 noon.     No current facility-administered medications on file prior to visit.     Exam   Vitals:   10/25/22 1019  BP: 131/80  Pulse: (!) 109  Weight:  (!) 362 lb (164.2 kg)   Fetal Heart Rate (bpm): 143  Uterus:     Pelvic Exam: Perineum: no hemorrhoids, normal perineum   Vulva: normal external genitalia, no lesions   Vagina:  normal mucosa mild amount of white discharge but denies any symptoms   Cervix: no lesions and normal, pap smear done.   System: General: well-developed, well-nourished female in no acute distress   Skin: normal coloration and turgor, no rashes   Neurologic: oriented, normal, negative, normal mood   Extremities: normal strength, tone, and muscle mass, ROM of all joints is normal   HEENT PERRLA, extraocular movement intact and sclera clear, anicteric   Neck supple and no masses   Respiratory:  no respiratory distress      Assessment:   Pregnancy: G1P0 Patient Active Problem List   Diagnosis Date Noted   Rh negative state in antepartum period 10/25/2022   Chronic hypertension affecting pregnancy 10/22/2022   Maternal morbid obesity, antepartum ([place]) 10/22/2022   Supervision of high risk pregnancy, antepartum 10/18/2022     Plan:  1. Supervision of high risk pregnancy, antepartum BP and FHR normal Initial labs drawn. Continue prenatal vitamins. Genetic Screening discussed, NIPS: ordered. Ultrasound discussed; fetal anatomic survey: ordered. Problem list reviewed and updated. The nature of [facility] clinic was explained to patient; Voiced  they may need to be seen by other [facility] providers which includes family medicine physicians, OB GYNs, and APPs. Delivery will hopefully be with one of the [facility] providers or another [facility] Medicine physician and we cannot promise this at this time.  Discussed there are [facility] staff in the hospital 24-7 and they understand and support this model and there is a likelihood one of these providers will catch their baby.  We also discussed that the service includes learners (residents, student) and they will be involved in the care team.   2. Chronic hypertension affecting  pregnancy Normotensive on Nifedipine 30 XL, taking ASA Baseline labs today Antenatal testing per MFM guidelines  3. Maternal morbid obesity, antepartum ([place])   4. Screening for cervical cancer Pap collected today  5. Rh negative state in antepartum period Rhogam at 28 weeks   Routine obstetric precautions reviewed. Return in 4 weeks (on 11/22/2022) for [facility] patient, ob visit.

## 2022-10-25 NOTE — Patient Instructions (Signed)
Second Trimester of Pregnancy  The second trimester of pregnancy is from week 13 through week 27. This is months 4 through 6 of pregnancy. The second trimester is often a time when you feel your best. Your body has adjusted to being pregnant, and you begin to feel better physically. During the second trimester: Morning sickness has lessened or stopped completely. You may have more energy. You may have an increase in appetite. The second trimester is also a time when the unborn baby (fetus) is growing rapidly. At the end of the sixth month, the fetus may be up to 12 inches long and weigh about 1 pounds. You will likely begin to feel the baby move (quickening) between 16 and 20 weeks of pregnancy. Body changes during your second trimester Your body continues to go through many changes during your second trimester. The changes vary and generally return to normal after the baby is born. Physical changes Your weight will continue to increase. You will notice your lower abdomen bulging out. You may begin to get stretch marks on your hips, abdomen, and breasts. Your breasts will continue to grow and to become tender. Dark spots or blotches (chloasma or mask of pregnancy) may develop on your face. A dark line from your belly button to the pubic area (linea nigra) may appear. You may have changes in your hair. These can include thickening of your hair, rapid growth, and changes in texture. Some people also have hair loss during or after pregnancy, or hair that feels dry or thin. Health changes You may develop headaches. You may have heartburn. You may develop constipation. You may develop hemorrhoids or swollen, bulging veins (varicose veins). Your gums may bleed and may be sensitive to brushing and flossing. You may urinate more often because the fetus is pressing on your bladder. You may have back pain. This is caused by: Weight gain. Pregnancy hormones that are relaxing the joints in your  pelvis. A shift in weight and the muscles that support your balance. Follow these instructions at home: Medicines Follow your health care provider's instructions regarding medicine use. Specific medicines may be either safe or unsafe to take during pregnancy. Do not take any medicines unless approved by your health care provider. Take a prenatal vitamin that contains at least 600 micrograms (mcg) of folic acid. Eating and drinking Eat a healthy diet that includes fresh fruits and vegetables, whole grains, good sources of protein such as meat, eggs, or tofu, and low-fat dairy products. Avoid raw meat and unpasteurized juice, milk, and cheese. These carry germs that can harm you and your baby. You may need to take these actions to prevent or treat constipation: Drink enough fluid to keep your urine pale yellow. Eat foods that are high in fiber, such as beans, whole grains, and fresh fruits and vegetables. Limit foods that are high in fat and processed sugars, such as fried or sweet foods. Activity Exercise only as directed by your health care provider. Most people can continue their usual exercise routine during pregnancy. Try to exercise for 30 minutes at least 5 days a week. Stop exercising if you develop contractions in your uterus. Stop exercising if you develop pain or cramping in the lower abdomen or lower back. Avoid exercising if it is very hot or humid or if you are at a high altitude. Avoid heavy lifting. If you choose to, you may have sex unless your health care provider tells you not to. Relieving pain and discomfort Wear a supportive  bra to prevent discomfort from breast tenderness. Take warm sitz baths to soothe any pain or discomfort caused by hemorrhoids. Use hemorrhoid cream if your health care provider approves. Rest with your legs raised (elevated) if you have leg cramps or low back pain. If you develop varicose veins: Wear support hose as told by your health care  provider. Elevate your feet for 15 minutes, 3-4 times a day. Limit salt in your diet. Safety Wear your seat belt at all times when driving or riding in a car. Talk with your health care provider if someone is verbally or physically abusive to you. Lifestyle Do not use hot tubs, steam rooms, or saunas. Do not douche. Do not use tampons or scented sanitary pads. Avoid cat litter boxes and soil used by cats. These carry germs that can cause birth defects in the baby and possibly loss of the fetus by miscarriage or stillbirth. Do not use herbal remedies, alcohol, illegal drugs, or medicines that are not approved by your health care provider. Chemicals in these products can harm your baby. Do not use any products that contain nicotine or tobacco, such as cigarettes, e-cigarettes, and chewing tobacco. If you need help quitting, ask your health care provider. General instructions During a routine prenatal visit, your health care provider will do a physical exam and other tests. He or she will also discuss your overall health. Keep all follow-up visits. This is important. Ask your health care provider for a referral to a local prenatal education class. Ask for help if you have counseling or nutritional needs during pregnancy. Your health care provider can offer advice or refer you to specialists for help with various needs. Where to find more information American Pregnancy Association: americanpregnancy.org American College of Obstetricians and Gynecologists: acog.org/en/Womens%20Health/Pregnancy Office on Women's Health: womenshealth.gov/pregnancy Contact a health care provider if you have: A headache that does not go away when you take medicine. Vision changes or you see spots in front of your eyes. Mild pelvic cramps, pelvic pressure, or nagging pain in the abdominal area. Persistent nausea, vomiting, or diarrhea. A bad-smelling vaginal discharge or foul-smelling urine. Pain when you  urinate. Sudden or extreme swelling of your face, hands, ankles, feet, or legs. A fever. Get help right away if you: Have fluid leaking from your vagina. Have spotting or bleeding from your vagina. Have severe abdominal cramping or pain. Have difficulty breathing. Have chest pain. Have fainting spells. Have not felt your baby move for the time period told by your health care provider. Have new or increased pain, swelling, or redness in an arm or leg. Summary The second trimester of pregnancy is from week 13 through week 27 (months 4 through 6). Do not use herbal remedies, alcohol, illegal drugs, or medicines that are not approved by your health care provider. Chemicals in these products can harm your baby. Exercise only as directed by your health care provider. Most people can continue their usual exercise routine during pregnancy. Keep all follow-up visits. This is important. This information is not intended to replace advice given to you by your health care provider. Make sure you discuss any questions you have with your health care provider. Document Revised: 02/19/2020 Document Reviewed: 12/26/2019 Elsevier Patient Education  2023 Elsevier Inc.  Contraception Choices Contraception, also called birth control, refers to methods or devices that prevent pregnancy. Hormonal methods  Contraceptive implant A contraceptive implant is a thin, plastic tube that contains a hormone that prevents pregnancy. It is different from an intrauterine device (  IUD). It is inserted into the upper part of the arm by a health care provider. Implants can be effective for up to 3 years. Progestin-only injections Progestin-only injections are injections of progestin, a synthetic form of the hormone progesterone. They are given every 3 months by a health care provider. Birth control pills Birth control pills are pills that contain hormones that prevent pregnancy. They must be taken once a day, preferably at  the same time each day. A prescription is needed to use this method of contraception. Birth control patch The birth control patch contains hormones that prevent pregnancy. It is placed on the skin and must be changed once a week for three weeks and removed on the fourth week. A prescription is needed to use this method of contraception. Vaginal ring A vaginal ring contains hormones that prevent pregnancy. It is placed in the vagina for three weeks and removed on the fourth week. After that, the process is repeated with a new ring. A prescription is needed to use this method of contraception. Emergency contraceptive Emergency contraceptives prevent pregnancy after unprotected sex. They come in pill form and can be taken up to 5 days after sex. They work best the sooner they are taken after having sex. Most emergency contraceptives are available without a prescription. This method should not be used as your only form of birth control. Barrier methods  Female condom A female condom is a thin sheath that is worn over the penis during sex. Condoms keep sperm from going inside a woman's body. They can be used with a sperm-killing substance (spermicide) to increase their effectiveness. They should be thrown away after one use. Female condom A female condom is a soft, loose-fitting sheath that is put into the vagina before sex. The condom keeps sperm from going inside a woman's body. They should be thrown away after one use. Diaphragm A diaphragm is a soft, dome-shaped barrier. It is inserted into the vagina before sex, along with a spermicide. The diaphragm blocks sperm from entering the uterus, and the spermicide kills sperm. A diaphragm should be left in the vagina for 6-8 hours after sex and removed within 24 hours. A diaphragm is prescribed and fitted by a health care provider. A diaphragm should be replaced every 1-2 years, after giving birth, after gaining more than 15 lb (6.8 kg), and after pelvic  surgery. Cervical cap A cervical cap is a round, soft latex or plastic cup that fits over the cervix. It is inserted into the vagina before sex, along with spermicide. It blocks sperm from entering the uterus. The cap should be left in place for 6-8 hours after sex and removed within 48 hours. A cervical cap must be prescribed and fitted by a health care provider. It should be replaced every 2 years. Sponge A sponge is a soft, circular piece of polyurethane foam with spermicide in it. The sponge helps block sperm from entering the uterus, and the spermicide kills sperm. To use it, you make it wet and then insert it into the vagina. It should be inserted before sex, left in for at least 6 hours after sex, and removed and thrown away within 30 hours. Spermicides Spermicides are chemicals that kill or block sperm from entering the cervix and uterus. They can come as a cream, jelly, suppository, foam, or tablet. A spermicide should be inserted into the vagina with an applicator at least 10-15 minutes before sex to allow time for it to work. The process must be   repeated every time you have sex. Spermicides do not require a prescription. Intrauterine contraception Intrauterine device (IUD) An IUD is a T-shaped device that is put in a woman's uterus. There are two types: Hormone IUD.This type contains progestin, a synthetic form of the hormone progesterone. This type can stay in place for 3-5 years. Copper IUD.This type is wrapped in copper wire. It can stay in place for 10 years. Permanent methods of contraception Female tubal ligation In this method, a woman's fallopian tubes are sealed, tied, or blocked during surgery to prevent eggs from traveling to the uterus. Hysteroscopic sterilization In this method, a small, flexible insert is placed into each fallopian tube. The inserts cause scar tissue to form in the fallopian tubes and block them, so sperm cannot reach an egg. The procedure takes about 3  months to be effective. Another form of birth control must be used during those 3 months. Female sterilization This is a procedure to tie off the tubes that carry sperm (vasectomy). After the procedure, the man can still ejaculate fluid (semen). Another form of birth control must be used for 3 months after the procedure. Natural planning methods Natural family planning In this method, a couple does not have sex on days when the woman could become pregnant. Calendar method In this method, the woman keeps track of the length of each menstrual cycle, identifies the days when pregnancy can happen, and does not have sex on those days. Ovulation method In this method, a couple avoids sex during ovulation. Symptothermal method This method involves not having sex during ovulation. The woman typically checks for ovulation by watching changes in her temperature and in the consistency of cervical mucus. Post-ovulation method In this method, a couple waits to have sex until after ovulation. Where to find more information Centers for Disease Control and Prevention: www.cdc.gov Summary Contraception, also called birth control, refers to methods or devices that prevent pregnancy. Hormonal methods of contraception include implants, injections, pills, patches, vaginal rings, and emergency contraceptives. Barrier methods of contraception can include female condoms, female condoms, diaphragms, cervical caps, sponges, and spermicides. There are two types of IUDs (intrauterine devices). An IUD can be put in a woman's uterus to prevent pregnancy for 3-5 years. Permanent sterilization can be done through a procedure for males and females. Natural family planning methods involve nothaving sex on days when the woman could become pregnant. This information is not intended to replace advice given to you by your health care provider. Make sure you discuss any questions you have with your health care provider. Document  Revised: 02/17/2020 Document Reviewed: 02/17/2020 Elsevier Patient Education  2023 Elsevier Inc.  

## 2022-10-26 ENCOUNTER — Encounter: Payer: Self-pay | Admitting: *Deleted

## 2022-10-26 LAB — CBC/D/PLT+RPR+RH+ABO+RUBIGG...
Antibody Screen: NEGATIVE
Basophils Absolute: 0 10*3/uL (ref 0.0–0.2)
Basos: 0 %
EOS (ABSOLUTE): 0.1 10*3/uL (ref 0.0–0.4)
Eos: 1 %
HCV Ab: NONREACTIVE
HIV Screen 4th Generation wRfx: NONREACTIVE
Hematocrit: 38.5 % (ref 34.0–46.6)
Hemoglobin: 12.2 g/dL (ref 11.1–15.9)
Hepatitis B Surface Ag: NEGATIVE
Immature Grans (Abs): 0.1 10*3/uL (ref 0.0–0.1)
Immature Granulocytes: 1 %
Lymphocytes Absolute: 2.2 10*3/uL (ref 0.7–3.1)
Lymphs: 26 %
MCH: 24.3 pg — ABNORMAL LOW (ref 26.6–33.0)
MCHC: 31.7 g/dL (ref 31.5–35.7)
MCV: 77 fL — ABNORMAL LOW (ref 79–97)
Monocytes Absolute: 0.7 10*3/uL (ref 0.1–0.9)
Monocytes: 8 %
Neutrophils Absolute: 5.5 10*3/uL (ref 1.4–7.0)
Neutrophils: 64 %
Platelets: 358 10*3/uL (ref 150–450)
RBC: 5.03 x10E6/uL (ref 3.77–5.28)
RDW: 13.2 % (ref 11.7–15.4)
RPR Ser Ql: NONREACTIVE
Rh Factor: NEGATIVE
Rubella Antibodies, IGG: 1 index (ref >0.99)
WBC: 8.4 10*3/uL (ref 3.4–10.8)

## 2022-10-26 LAB — COMPREHENSIVE METABOLIC PANEL
ALT: 16 IU/L (ref 0–32)
AST: 16 IU/L (ref 0–40)
Albumin/Globulin Ratio: 1.3 (ref 1.2–2.2)
Albumin: 3.9 g/dL (ref 3.9–4.9)
Alkaline Phosphatase: 68 IU/L (ref 44–121)
BUN/Creatinine Ratio: 12 (ref 9–23)
BUN: 7 mg/dL (ref 6–20)
Bilirubin Total: 0.6 mg/dL (ref 0.0–1.2)
CO2: 17 mmol/L — ABNORMAL LOW (ref 20–29)
Calcium: 9.1 mg/dL (ref 8.7–10.2)
Chloride: 101 mmol/L (ref 96–106)
Creatinine, Ser: 0.59 mg/dL (ref 0.57–1.00)
Globulin, Total: 3 g/dL (ref 1.5–4.5)
Glucose: 75 mg/dL (ref 70–99)
Potassium: 3.8 mmol/L (ref 3.5–5.2)
Sodium: 134 mmol/L (ref 134–144)
Total Protein: 6.9 g/dL (ref 6.0–8.5)
eGFR: 123 mL/min/{1.73_m2} (ref >59)

## 2022-10-26 LAB — HCV INTERPRETATION

## 2022-10-26 LAB — HEMOGLOBIN A1C
Est. average glucose Bld gHb Est-mCnc: 108 mg/dL
Hgb A1c MFr Bld: 5.4 % (ref 4.8–5.6)

## 2022-10-27 LAB — PROTEIN / CREATININE RATIO, URINE
Creatinine, Urine: 83.5 mg/dL
Protein, Ur: 30.2 mg/dL
Protein/Creat Ratio: 362 mg/g creat — ABNORMAL HIGH (ref 0–200)

## 2022-10-27 LAB — URINE CULTURE, OB REFLEX

## 2022-10-27 LAB — CULTURE, OB URINE

## 2022-10-28 LAB — CYTOLOGY - PAP
Chlamydia: NEGATIVE
Comment: NEGATIVE
Comment: NEGATIVE
Comment: NEGATIVE
Comment: NORMAL
Diagnosis: NEGATIVE
High risk HPV: NEGATIVE
Neisseria Gonorrhea: NEGATIVE
Trichomonas: NEGATIVE

## 2022-10-31 ENCOUNTER — Encounter: Payer: Self-pay | Admitting: Family Medicine

## 2022-10-31 LAB — PANORAMA PRENATAL TEST FULL PANEL:PANORAMA TEST PLUS 5 ADDITIONAL MICRODELETIONS: FETAL FRACTION: 3.3

## 2022-11-07 ENCOUNTER — Encounter: Payer: Self-pay | Admitting: *Deleted

## 2022-11-09 ENCOUNTER — Telehealth: Payer: Self-pay | Admitting: Lactation Services

## 2022-11-09 ENCOUNTER — Encounter: Payer: Self-pay | Admitting: Family Medicine

## 2022-11-09 DIAGNOSIS — D563 Thalassemia minor: Secondary | ICD-10-CM | POA: Insufficient documentation

## 2022-11-09 LAB — HORIZON CUSTOM: REPORT SUMMARY: POSITIVE — AB

## 2022-11-09 NOTE — Telephone Encounter (Signed)
Called patient to inform her of Horizon Screening results. She did not answer. LM to check MyChart message for results.

## 2022-11-09 NOTE — Telephone Encounter (Signed)
-----   Message from Clarnce Flock, MD sent at 11/09/2022  9:16 AM EST ----- Abnormal Horizon genetic screen Problem list updated Please call patient to notify her of the result and refer to [facility] for genetic counseling

## 2022-11-21 ENCOUNTER — Ambulatory Visit: Payer: Medicaid Other | Attending: Cardiology | Admitting: Cardiology

## 2022-11-21 ENCOUNTER — Encounter: Payer: Self-pay | Admitting: Cardiology

## 2022-11-21 ENCOUNTER — Encounter: Payer: Self-pay | Admitting: Family Medicine

## 2022-11-21 ENCOUNTER — Ambulatory Visit (INDEPENDENT_AMBULATORY_CARE_PROVIDER_SITE_OTHER): Payer: Medicaid Other | Admitting: Family Medicine

## 2022-11-21 ENCOUNTER — Other Ambulatory Visit: Payer: Self-pay

## 2022-11-21 VITALS — BP 121/79 | HR 102 | Wt 363.0 lb

## 2022-11-21 VITALS — BP 120/90 | HR 95 | Ht 65.0 in | Wt 365.6 lb

## 2022-11-21 DIAGNOSIS — O10919 Unspecified pre-existing hypertension complicating pregnancy, unspecified trimester: Secondary | ICD-10-CM | POA: Diagnosis not present

## 2022-11-21 DIAGNOSIS — O26899 Other specified pregnancy related conditions, unspecified trimester: Secondary | ICD-10-CM

## 2022-11-21 DIAGNOSIS — O99212 Obesity complicating pregnancy, second trimester: Secondary | ICD-10-CM

## 2022-11-21 DIAGNOSIS — Z3A18 18 weeks gestation of pregnancy: Secondary | ICD-10-CM

## 2022-11-21 DIAGNOSIS — Z6791 Unspecified blood type, Rh negative: Secondary | ICD-10-CM

## 2022-11-21 DIAGNOSIS — O10912 Unspecified pre-existing hypertension complicating pregnancy, second trimester: Secondary | ICD-10-CM

## 2022-11-21 DIAGNOSIS — O26892 Other specified pregnancy related conditions, second trimester: Secondary | ICD-10-CM

## 2022-11-21 DIAGNOSIS — O0992 Supervision of high risk pregnancy, unspecified, second trimester: Secondary | ICD-10-CM

## 2022-11-21 DIAGNOSIS — O0990 Supervision of high risk pregnancy, unspecified, unspecified trimester: Secondary | ICD-10-CM

## 2022-11-21 DIAGNOSIS — D563 Thalassemia minor: Secondary | ICD-10-CM

## 2022-11-21 NOTE — Progress Notes (Signed)
Cardio-Obstetrics Clinic  New Evaluation  Date:  11/21/2022   ID:  Becky Porter, DOB [birth year], MRN [id number]  PCP:  Pcp, No   [facility] Providers Cardiologist:  None  Electrophysiologist:  None       Referring MD: Osborne Oman, MD   Chief Complaint: I am doing okay  History of Present Illness:    Becky Porter is a 32 y.o. female [G1P0] who is being seen today for the evaluation of chronic hypertension pregnancy at the request of Anyanwu, Sallyanne Havers, MD.   Medical history include chronic hypertension, morbid obesity here today to be evaluated for elevated blood pressure.   Prior CV Studies Reviewed: The following studies were reviewed today: None available  Past Medical History:  Diagnosis Date   Medical history non-contributory     No past surgical history on file.    OB History     Gravida  1   Para      Term      Preterm      AB      Living         SAB      IAB      Ectopic      Multiple      Live Births                  Current Medications: Current Meds  Medication Sig   aspirin EC 81 MG tablet Take 1 tablet (81 mg total) by mouth daily. Start taking when you are [redacted] weeks pregnant for rest of pregnancy for prevention of preeclampsia   aspirin-acetaminophen-caffeine (EXCEDRIN MIGRAINE) 250-250-65 MG tablet Take 2 tablets by mouth every 6 (six) hours as needed for headache.   prenatal vitamin w/FE, FA (PRENATAL 1 + 1) 27-1 MG TABS tablet Take 1 tablet by mouth daily at 12 noon.     Allergies:   Patient has no known allergies.   Social History   Socioeconomic History   Marital status: Single    Spouse name: Not on file   Number of children: Not on file   Years of education: Not on file   Highest education level: Not on file  Occupational History   Not on file  Tobacco Use   Smoking status: Never   Smokeless tobacco: Not on file  Substance and Sexual Activity   Alcohol use: Not Currently    Comment:  social   Drug use: No   Sexual activity: Yes    Birth control/protection: None  Other Topics Concern   Not on file  Social History Narrative   Not on file   Social Determinants of Health   Financial Resource Strain: Not on file  Food Insecurity: Not on file  Transportation Needs: Not on file  Physical Activity: Not on file  Stress: Not on file  Social Connections: Not on file      No family history on file.    ROS:   Please see the history of present illness.     All other systems reviewed and are negative.   Labs/EKG Reviewed:    EKG:   EKG is was ordered today.  The ekg ordered today demonstrates sinus rhythm with arrhythmia.  Heart rate 95 bpm.  Recent Labs: 10/25/2022: ALT 16; BUN 7; Creatinine, Ser 0.59; Hemoglobin 12.2; Platelets 358; Potassium 3.8; Sodium 134   Recent Lipid Panel No results found for: "CHOL", "TRIG", "HDL", "CHOLHDL", "LDLCALC", "LDLDIRECT"  Physical Exam:    VS:  BP (!) 120/90   Pulse 95   Ht '5\' 5"'$  (1.651 m)   Wt (!) 165.8 kg   LMP 07/18/2022   SpO2 99%   BMI 60.84 kg/m     Wt Readings from Last 3 Encounters:  11/21/22 (!) 165.8 kg  11/21/22 (!) 164.7 kg  10/25/22 (!) 164.2 kg     GEN:  Well nourished, well developed in no acute distress HEENT: Normal NECK: No JVD; No carotid bruits LYMPHATICS: No lymphadenopathy CARDIAC: RRR, no murmurs, rubs, gallops RESPIRATORY:  Clear to auscultation without rales, wheezing or rhonchi  ABDOMEN: Soft, non-tender, non-distended MUSCULOSKELETAL:  No edema; No deformity  SKIN: Warm and dry NEUROLOGIC:  Alert and oriented x 3 PSYCHIATRIC:  Normal affect    Risk Assessment/Risk Calculators:     CARPREG II Risk Prediction Index Score:  1.  The patient's risk for a primary cardiac event is 5%.            ASSESSMENT & PLAN:    Started on nifedipine but unfortunately has not pick up this medication.  Today in the office using a right size cuff her blood pressure is at target.  She does  tell me that she uses the small size blood pressure cuff which a small cuff will falsely elevate her blood pressure.  Of asked the patient to bring the cuff that she is using.  In the meantime also encouraged her to get a larger size cuff that would help [place] get an accurate reading.  She is on aspirin 81 mg daily for preeclampsia prophylaxis we will continue that.  Patient Instructions  Medication Instructions:  Your physician recommends that you continue on your current medications as directed. Please refer to the Current Medication list given to you today.  *If you need a refill on your cardiac medications before your next appointment, please call your pharmacy*   Lab Work: NONE If you have labs (blood work) drawn today and your tests are completely normal, you will receive your results only by: [place] (if you have MyChart) OR A paper copy in the mail If you have any lab test that is abnormal or we need to change your treatment, we will call you to review the results.   Testing/Procedures: NONE   Follow-Up: At [facility], you and your health needs are our priority.  As part of our continuing mission to provide you with exceptional heart care, we have created designated Provider Care Teams.  These Care Teams include your primary Cardiologist (physician) and Advanced Practice Providers (APPs -  Physician Assistants and Nurse Practitioners) who all work together to provide you with the care you need, when you need it.  We recommend signing up for the patient portal called "MyChart".  Sign up information is provided on this After Visit Summary.  MyChart is used to connect with patients for Virtual Visits (Telemedicine).  Patients are able to view lab/test results, encounter notes, upcoming appointments, etc.  Non-urgent messages can be sent to your provider as well.   To learn more about what you can do with MyChart, go to [email or web address].    Your next  appointment:   10 week(s) It's okay to [place]   Provider:   Berniece Salines, DO      Dispo:  Return in about 10 weeks (around 01/30/2023).   Medication Adjustments/Labs and Tests Ordered: Current medicines are reviewed at length with the patient today.  Concerns regarding medicines are outlined above.  Tests Ordered: Orders Placed  This Encounter  Procedures   EKG 12-Lead   Medication Changes: No orders of the defined types were placed in this encounter.

## 2022-11-21 NOTE — Patient Instructions (Signed)
Medication Instructions:  Your physician recommends that you continue on your current medications as directed. Please refer to the Current Medication list given to you today.  *If you need a refill on your cardiac medications before your next appointment, please call your pharmacy*   Lab Work: NONE If you have labs (blood work) drawn today and your tests are completely normal, you will receive your results only by: [place] (if you have MyChart) OR A paper copy in the mail If you have any lab test that is abnormal or we need to change your treatment, we will call you to review the results.   Testing/Procedures: NONE   Follow-Up: At [facility], you and your health needs are our priority.  As part of our continuing mission to provide you with exceptional heart care, we have created designated Provider Care Teams.  These Care Teams include your primary Cardiologist (physician) and Advanced Practice Providers (APPs -  Physician Assistants and Nurse Practitioners) who all work together to provide you with the care you need, when you need it.  We recommend signing up for the patient portal called "MyChart".  Sign up information is provided on this After Visit Summary.  MyChart is used to connect with patients for Virtual Visits (Telemedicine).  Patients are able to view lab/test results, encounter notes, upcoming appointments, etc.  Non-urgent messages can be sent to your provider as well.   To learn more about what you can do with MyChart, go to [email or web address].    Your next appointment:   10 week(s) It's okay to [place]   Provider:   Berniece Salines, DO

## 2022-11-21 NOTE — Progress Notes (Signed)
   PRENATAL VISIT NOTE  Subjective:  Becky Porter is a 32 y.o. G1P0 at 44w0dbeing seen today for ongoing prenatal care.  She is currently monitored for the following issues for this high-risk pregnancy and has Supervision of high risk pregnancy, antepartum; Chronic hypertension affecting pregnancy; Maternal morbid obesity, [place]; Rh negative state in antepartum period; and Alpha thalassemia silent carrier on their problem list.  Patient reports  Lower abdominal pain .   . Vag. Bleeding: None.  Movement: Absent. Denies leaking of fluid.   The following portions of the patient's history were reviewed and updated as appropriate: allergies, current medications, past family history, past medical history, past social history, past surgical history and problem list.   Objective:   Vitals:   11/21/22 0946  BP: 121/79  Pulse: (!) 102  Weight: (!) 363 lb (164.7 kg)    Fetal Status: Fetal Heart Rate (bpm): 146   Movement: Absent     General:  Alert, oriented and cooperative. Patient is in no acute distress.  Skin: Skin is warm and dry. No rash noted.   Cardiovascular: Normal heart rate noted  Respiratory: Normal respiratory effort, no problems with respiration noted  Abdomen: Soft, gravid, appropriate for gestational age.  Pain/Pressure: Absent     Pelvic: Cervical exam deferred        Extremities: Normal range of motion.  Edema: None  Mental Status: Normal mood and affect. Normal behavior. Normal judgment and thought content.   Assessment and Plan:  Pregnancy: G1P0 at 135w0d. Supervision of high risk pregnancy, antepartum Up to date Has USKoreapcoming for anatomy Has some lower abdominal stretching/pain. Discussed broad ligament pain. Patient has FMLA and Disability Insurance papers-- [facility]   2. Alpha thalassemia silent carrier Partner kit given today Discussed referral to genetics if partner is positive carrier  3. Chronic hypertension affecting  pregnancy BP wnl, taken at forearm Continue ASA Continue Procardia  4. Maternal morbid obesity, antepartum (HCC) TWG= -12 lb (-5.443 kg) from reported pregnancy weight of 375, she is up one pound from initial prenatal appt.  11-15 lbs of weight gain expected  5. Rh negative state in antepartum period Rhogam at 28 weeks  Preterm labor symptoms and general obstetric precautions including but not limited to vaginal bleeding, contractions, leaking of fluid and fetal movement were reviewed in detail with the patient. Please refer to After Visit Summary for other counseling recommendations.   Return in about 4 weeks (around 12/19/2022) for Mom+Baby Combined Care.  Future Appointments  Date Time Provider De[place]2/26/2024  3:40 PM ToBerniece SalinesDO [facility] None  11/28/2022  9:30 AM [facility] NURSE [facility]/12/2022  9:45 AM [facility] US5 [facility]3/28/2024 10:15 AM EcDione PloverMaAnnice NeedyMD [facility]  KiCaren MacadamMD

## 2022-11-28 ENCOUNTER — Encounter: Payer: Self-pay | Admitting: *Deleted

## 2022-11-28 ENCOUNTER — Other Ambulatory Visit: Payer: Self-pay | Admitting: *Deleted

## 2022-11-28 ENCOUNTER — Ambulatory Visit: Payer: Medicaid Other | Admitting: *Deleted

## 2022-11-28 ENCOUNTER — Other Ambulatory Visit: Payer: Self-pay | Admitting: Family Medicine

## 2022-11-28 ENCOUNTER — Ambulatory Visit: Payer: Medicaid Other | Attending: Family Medicine

## 2022-11-28 VITALS — BP 127/65 | HR 97

## 2022-11-28 DIAGNOSIS — O99212 Obesity complicating pregnancy, second trimester: Secondary | ICD-10-CM | POA: Insufficient documentation

## 2022-11-28 DIAGNOSIS — Z3A17 17 weeks gestation of pregnancy: Secondary | ICD-10-CM | POA: Diagnosis not present

## 2022-11-28 DIAGNOSIS — O0990 Supervision of high risk pregnancy, unspecified, unspecified trimester: Secondary | ICD-10-CM

## 2022-11-28 DIAGNOSIS — Z6791 Unspecified blood type, Rh negative: Secondary | ICD-10-CM | POA: Diagnosis not present

## 2022-11-28 DIAGNOSIS — O321XX0 Maternal care for breech presentation, not applicable or unspecified: Secondary | ICD-10-CM | POA: Insufficient documentation

## 2022-11-28 DIAGNOSIS — O10912 Unspecified pre-existing hypertension complicating pregnancy, second trimester: Secondary | ICD-10-CM

## 2022-11-28 DIAGNOSIS — Z3480 Encounter for supervision of other normal pregnancy, unspecified trimester: Secondary | ICD-10-CM

## 2022-11-28 DIAGNOSIS — Z362 Encounter for other antenatal screening follow-up: Secondary | ICD-10-CM

## 2022-11-28 DIAGNOSIS — Z363 Encounter for antenatal screening for malformations: Secondary | ICD-10-CM | POA: Insufficient documentation

## 2022-11-28 DIAGNOSIS — O10012 Pre-existing essential hypertension complicating pregnancy, second trimester: Secondary | ICD-10-CM | POA: Insufficient documentation

## 2022-11-28 DIAGNOSIS — O26892 Other specified pregnancy related conditions, second trimester: Secondary | ICD-10-CM | POA: Diagnosis not present

## 2022-12-22 ENCOUNTER — Ambulatory Visit (INDEPENDENT_AMBULATORY_CARE_PROVIDER_SITE_OTHER): Payer: Medicaid Other | Admitting: Family Medicine

## 2022-12-22 ENCOUNTER — Encounter: Payer: Self-pay | Admitting: Family Medicine

## 2022-12-22 VITALS — BP 139/80 | HR 98 | Wt 370.0 lb

## 2022-12-22 DIAGNOSIS — O0990 Supervision of high risk pregnancy, unspecified, unspecified trimester: Secondary | ICD-10-CM

## 2022-12-22 DIAGNOSIS — O99212 Obesity complicating pregnancy, second trimester: Secondary | ICD-10-CM

## 2022-12-22 DIAGNOSIS — O26892 Other specified pregnancy related conditions, second trimester: Secondary | ICD-10-CM

## 2022-12-22 DIAGNOSIS — O10912 Unspecified pre-existing hypertension complicating pregnancy, second trimester: Secondary | ICD-10-CM

## 2022-12-22 DIAGNOSIS — Z6791 Unspecified blood type, Rh negative: Secondary | ICD-10-CM

## 2022-12-22 DIAGNOSIS — O0992 Supervision of high risk pregnancy, unspecified, second trimester: Secondary | ICD-10-CM

## 2022-12-22 DIAGNOSIS — D563 Thalassemia minor: Secondary | ICD-10-CM

## 2022-12-22 DIAGNOSIS — O10919 Unspecified pre-existing hypertension complicating pregnancy, unspecified trimester: Secondary | ICD-10-CM

## 2022-12-22 DIAGNOSIS — Z3A21 21 weeks gestation of pregnancy: Secondary | ICD-10-CM

## 2022-12-22 DIAGNOSIS — R519 Headache, unspecified: Secondary | ICD-10-CM

## 2022-12-22 MED ORDER — MAGNESIUM 400 MG PO CAPS: 1.0000 | ORAL_CAPSULE | Freq: Every day | ORAL | 5 refills | Status: DC

## 2022-12-22 MED ORDER — CYCLOBENZAPRINE HCL 10 MG PO TABS: 10.0000 mg | ORAL_TABLET | Freq: Three times a day (TID) | ORAL | 1 refills | Status: DC | PRN

## 2022-12-22 NOTE — Patient Instructions (Signed)

## 2022-12-22 NOTE — Progress Notes (Signed)
   Subjective:  Becky Porter is a 32 y.o. G1P0 at [redacted]w[redacted]d being seen today for ongoing prenatal care.  She is currently monitored for the following issues for this high-risk pregnancy and has Supervision of high risk pregnancy, antepartum; Chronic hypertension affecting pregnancy; Maternal morbid obesity, antepartum ([place]); Rh negative state in antepartum period; and Alpha thalassemia silent carrier on their problem list.  Patient reports headache.  Contractions: Not present. Vag. Bleeding: None.  Movement: Present. Denies leaking of fluid.   The following portions of the patient's history were reviewed and updated as appropriate: allergies, current medications, past family history, past medical history, past social history, past surgical history and problem list. Problem list updated.  Objective:   Vitals:   12/22/22 1101  BP: 139/80  Pulse: 98  Weight: (!) 370 lb (167.8 kg)    Fetal Status:     Movement: Present     General:  Alert, oriented and cooperative. Patient is in no acute distress.  Skin: Skin is warm and dry. No rash noted.   Cardiovascular: Normal heart rate noted  Respiratory: Normal respiratory effort, no problems with respiration noted  Abdomen: Soft, gravid, appropriate for gestational age. Pain/Pressure: Absent     Pelvic: Vag. Bleeding: None     Cervical exam deferred        Extremities: Normal range of motion.     Mental Status: Normal mood and affect. Normal behavior. Normal judgment and thought content.   Urinalysis:      Assessment and Plan:  Pregnancy: G1P0 at [redacted]w[redacted]d  1. Supervision of high risk pregnancy, antepartum BP normotensive [place] used and subjectively normal FHR seen, baby was so active I was unable to get a solid period of visualization and then [place] ran out of battery, but overall normal findings AFP collected today Job is giving her trouble about coming to visits, provided with letter stating she is high risk pregnancy and needs to come to visits -  AFP, Serum, Open Spina Bifida  2. Rh negative state in antepartum period Rhogam at 28 weeks  3. Chronic hypertension affecting pregnancy On Nifed 30 XL and ASA, normotensive Following w MFM  4. Alpha thalassemia silent carrier Partner lost paper, we will send new form through mychart  5. Maternal morbid obesity, antepartum ([place]) Following w MFM  6. Pregnancy headache in second trimester Getting daily migraines Trial flexeril and daily magnesium prophylaxis  Preterm labor symptoms and general obstetric precautions including but not limited to vaginal bleeding, contractions, leaking of fluid and fetal movement were reviewed in detail with the patient. Please refer to After Visit Summary for other counseling recommendations.  Return in 4 weeks (on 01/19/2023) for [place] patient, ob visit.   Clarnce Flock, MD

## 2022-12-23 ENCOUNTER — Encounter: Payer: Self-pay | Admitting: Family Medicine

## 2022-12-28 ENCOUNTER — Ambulatory Visit: Payer: Medicaid Other

## 2022-12-28 LAB — AFP, SERUM, OPEN SPINA BIFIDA
AFP MoM: 0.8
AFP Value: 38.5 ng/mL
Gest. Age on Collection Date: 21.3 weeks
Maternal Age At EDD: 32.1 yr
OSBR Risk 1 IN: 10000
Test Results:: NEGATIVE
Weight: 370 [lb_av]

## 2023-01-02 ENCOUNTER — Other Ambulatory Visit: Payer: Self-pay

## 2023-01-02 ENCOUNTER — Inpatient Hospital Stay (HOSPITAL_COMMUNITY)
Admission: AD | Admit: 2023-01-02 | Discharge: 2023-01-02 | Disposition: A | Discharge status: Home / Self Care | Payer: Medicaid Other | Attending: Obstetrics and Gynecology | Admitting: Obstetrics and Gynecology

## 2023-01-02 ENCOUNTER — Encounter (HOSPITAL_COMMUNITY): Payer: Self-pay | Admitting: Obstetrics and Gynecology

## 2023-01-02 DIAGNOSIS — O26892 Other specified pregnancy related conditions, second trimester: Secondary | ICD-10-CM | POA: Insufficient documentation

## 2023-01-02 DIAGNOSIS — O10912 Unspecified pre-existing hypertension complicating pregnancy, second trimester: Secondary | ICD-10-CM | POA: Diagnosis not present

## 2023-01-02 DIAGNOSIS — Z3A22 22 weeks gestation of pregnancy: Secondary | ICD-10-CM | POA: Diagnosis not present

## 2023-01-02 DIAGNOSIS — O0990 Supervision of high risk pregnancy, unspecified, unspecified trimester: Secondary | ICD-10-CM

## 2023-01-02 DIAGNOSIS — O99012 Anemia complicating pregnancy, second trimester: Secondary | ICD-10-CM | POA: Insufficient documentation

## 2023-01-02 DIAGNOSIS — O10919 Unspecified pre-existing hypertension complicating pregnancy, unspecified trimester: Secondary | ICD-10-CM

## 2023-01-02 LAB — CBC WITH DIFFERENTIAL/PLATELET
Abs Immature Granulocytes: 0.11 10*3/uL — ABNORMAL HIGH (ref 0.00–0.07)
Basophils Absolute: 0 10*3/uL (ref 0.0–0.1)
Basophils Relative: 0 %
Eosinophils Absolute: 0.1 10*3/uL (ref 0.0–0.5)
Eosinophils Relative: 1 %
HCT: 32.4 % — ABNORMAL LOW (ref 36.0–46.0)
Hemoglobin: 10.6 g/dL — ABNORMAL LOW (ref 12.0–15.0)
Immature Granulocytes: 1 %
Lymphocytes Relative: 23 %
Lymphs Abs: 2.7 10*3/uL (ref 0.7–4.0)
MCH: 24.9 pg — ABNORMAL LOW (ref 26.0–34.0)
MCHC: 32.7 g/dL (ref 30.0–36.0)
MCV: 76.2 fL — ABNORMAL LOW (ref 80.0–100.0)
Monocytes Absolute: 1.1 10*3/uL — ABNORMAL HIGH (ref 0.1–1.0)
Monocytes Relative: 9 %
Neutro Abs: 7.8 10*3/uL — ABNORMAL HIGH (ref 1.7–7.7)
Neutrophils Relative %: 66 %
Platelets: 311 10*3/uL (ref 150–400)
RBC: 4.25 MIL/uL (ref 3.87–5.11)
RDW: 13.4 % (ref 11.5–15.5)
WBC: 11.8 10*3/uL — ABNORMAL HIGH (ref 4.0–10.5)
nRBC: 0 % (ref 0.0–0.2)

## 2023-01-02 LAB — COMPREHENSIVE METABOLIC PANEL
ALT: 18 U/L (ref 0–44)
AST: 17 U/L (ref 15–41)
Albumin: 2.6 g/dL — ABNORMAL LOW (ref 3.5–5.0)
Alkaline Phosphatase: 65 U/L (ref 38–126)
Anion gap: 10 (ref 5–15)
BUN: 7 mg/dL (ref 6–20)
CO2: 22 mmol/L (ref 22–32)
Calcium: 8.7 mg/dL — ABNORMAL LOW (ref 8.9–10.3)
Chloride: 102 mmol/L (ref 98–111)
Creatinine, Ser: 0.76 mg/dL (ref 0.44–1.00)
GFR, Estimated: >60 mL/min (ref >60)
Glucose, Bld: 110 mg/dL — ABNORMAL HIGH (ref 70–99)
Potassium: 3.4 mmol/L — ABNORMAL LOW (ref 3.5–5.1)
Sodium: 134 mmol/L — ABNORMAL LOW (ref 135–145)
Total Bilirubin: 0.1 mg/dL — ABNORMAL LOW (ref 0.3–1.2)
Total Protein: 6.5 g/dL (ref 6.5–8.1)

## 2023-01-02 LAB — URINALYSIS, ROUTINE W REFLEX MICROSCOPIC
Bilirubin Urine: NEGATIVE
Glucose, UA: NEGATIVE mg/dL
Hgb urine dipstick: NEGATIVE
Ketones, ur: NEGATIVE mg/dL
Leukocytes,Ua: NEGATIVE
Nitrite: NEGATIVE
Protein, ur: NEGATIVE mg/dL
Specific Gravity, Urine: 1.027 (ref 1.005–1.030)
pH: 5 (ref 5.0–8.0)

## 2023-01-02 MED ORDER — DOCUSATE SODIUM 100 MG PO CAPS: 100.0000 mg | ORAL_CAPSULE | Freq: Two times a day (BID) | ORAL | 2 refills | Status: DC | PRN

## 2023-01-02 MED ORDER — POLYSACCHARIDE IRON COMPLEX 150 MG PO CAPS: 150.0000 mg | ORAL_CAPSULE | ORAL | 2 refills | Status: DC

## 2023-01-02 MED ORDER — POLYETHYLENE GLYCOL 3350 17 G PO PACK: 17.0000 g | PACK | Freq: Every day | ORAL | 0 refills | Status: DC

## 2023-01-02 MED ORDER — POLYSACCHARIDE IRON COMPLEX 150 MG PO CAPS: 150.0000 mg | ORAL_CAPSULE | Freq: Every day | ORAL | 2 refills | Status: DC

## 2023-01-02 NOTE — MAU Note (Signed)
Becky Porter is a 32 y.o. at [redacted]w[redacted]d here in MAU reporting: dizziness for past 2 days.  States felt like she was going to pass out, but didn't.  Reports been drinking plenty of fluids and doesn't think she's dehydrated. Denies VB or LOF.  Reports +FM. LMP: NA Onset of complaint: 2 days Pain score: 5 Vitals:   01/02/23 1825  BP: 133/77  Pulse: (!) 109  Resp: 18  Temp: 98.3 F (36.8 C)  SpO2: 100%     FBP:ZWCHENID d/t maternal habitus Lab orders placed from triage:   UA

## 2023-01-02 NOTE — MAU Provider Note (Addendum)
History     CSN: [id number]  Arrival date and time: 01/02/23 1802   Event Date/Time   First Provider Initiated Contact with Patient 01/02/23 1850      Chief Complaint  Patient presents with   Dizziness   Becky Porter , a  32 y.o. G1P0 at 5316w6d presents to MAU with complaints of dizziness for the last 2 days. She states she has been at work and states she felt dizzy "thought she was going to pass out but sat down." She denies headache, shortness of breath, blurred vision, weakness, nausea and vomiting. She states she "drinks 2.5-3 bottles of water per day". She states she had some fruit and cucumbers and a small protien bowl today. She endorses some abdominal pain but states this is not a new problem and is on-going. She currently rates it a 5. And denies attempting to relieve. She endorses some constipation, but last BM was today  prior to arrival. She also reports on-going headaches and is being treated for them. She denies a headache at this time. She denies abnormal vaginal discharge, leaking of fluid, vaginal bleeding and endorses positive fetal movement.        OB History     Gravida  1   Para      Term      Preterm      AB      Living         SAB      IAB      Ectopic      Multiple      Live Births              Past Medical History:  Diagnosis Date   Hypertension     History reviewed. No pertinent surgical history.  Family History  Problem Relation Age of Onset   Kidney disease Father    Stroke Maternal Grandmother    Stroke Paternal Grandmother    Cancer Paternal Grandfather     Social History   Tobacco Use   Smoking status: Never  Vaping Use   Vaping Use: Never used  Substance Use Topics   Alcohol use: Not Currently    Comment: social   Drug use: No    Allergies: No Known Allergies  Medications Prior to Admission  Medication Sig Dispense Refill Last Dose   aspirin EC 81 MG tablet Take 1 tablet (81 mg total) by mouth daily.  Start taking when you are [redacted] weeks pregnant for rest of pregnancy for prevention of preeclampsia 300 tablet 2 01/02/2023   NIFEdipine (PROCARDIA-XL/NIFEDICAL-XL) 30 MG 24 hr tablet Take 1 tablet (30 mg total) by mouth daily. 30 tablet 2 01/02/2023   prenatal vitamin w/FE, FA (PRENATAL 1 + 1) 27-1 MG TABS tablet Take 1 tablet by mouth daily at 12 noon.   01/02/2023   aspirin-acetaminophen-caffeine (EXCEDRIN MIGRAINE) 250-250-65 MG tablet Take 2 tablets by mouth every 6 (six) hours as needed for headache. 30 tablet 0    cyclobenzaprine (FLEXERIL) 10 MG tablet Take 1 tablet (10 mg total) by mouth every 8 (eight) hours as needed for muscle spasms. 30 tablet 1    escitalopram (LEXAPRO) 20 MG tablet Take 20 mg by mouth as needed (depression). (Patient not taking: Reported on 10/18/2022)      Magnesium 400 MG CAPS Take 1 capsule by mouth daily. 30 capsule 5     Review of Systems  Constitutional:  Negative for chills, fatigue and fever.  Eyes:  Negative for pain  and visual disturbance.  Respiratory:  Negative for apnea, shortness of breath and wheezing.   Cardiovascular:  Negative for chest pain and palpitations.  Gastrointestinal:  Positive for abdominal pain. Negative for constipation, diarrhea, nausea and vomiting.  Genitourinary:  Negative for difficulty urinating, dysuria, pelvic pain, vaginal bleeding, vaginal discharge and vaginal pain.  Musculoskeletal:  Negative for back pain.  Neurological:  Positive for dizziness and headaches. Negative for seizures, syncope, weakness, light-headedness and numbness.  Psychiatric/Behavioral:  Negative for suicidal ideas.    Physical Exam   Blood pressure 133/77, pulse (!) 109, temperature 98.3 F (36.8 C), temperature source Oral, resp. rate 18, height 5\' 5"  (1.651 m), weight (!) 166.9 kg, last menstrual period 07/18/2022, SpO2 100 %.  Physical Exam Vitals and nursing note reviewed.  Constitutional:      General: She is not in acute distress.    Appearance:  Normal appearance.  HENT:     Head: Normocephalic.  Cardiovascular:     Rate and Rhythm: Normal rate.  Pulmonary:     Effort: Pulmonary effort is normal.     Breath sounds: Normal breath sounds.  Abdominal:     General: There is no distension.     Palpations: Abdomen is soft.     Tenderness: There is no abdominal tenderness. There is no guarding.     Comments: Pregnant  Musculoskeletal:        General: No swelling. Normal range of motion.     Cervical back: Normal range of motion.  Skin:    General: Skin is warm and dry.     Capillary Refill: Capillary refill takes 2 to 3 seconds.  Neurological:     Mental Status: She is alert and oriented to person, place, and time.  Psychiatric:        Mood and Affect: Mood normal.     MAU Course  Procedures Orders Placed This Encounter  Procedures   CBC with Differential/Platelet   Comprehensive metabolic panel   Urinalysis, Routine w reflex microscopic -Urine, Clean Catch   ED EKG   Results for orders placed or performed during the hospital encounter of 01/02/23 (from the past 24 hour(s))  Urinalysis, Routine w reflex microscopic -Urine, Clean Catch     Status: Abnormal   Collection Time: 01/02/23  6:54 PM  Result Value Ref Range   Color, Urine YELLOW YELLOW   APPearance HAZY (A) CLEAR   Specific Gravity, Urine 1.027 1.005 - 1.030   pH 5.0 5.0 - 8.0   Glucose, UA NEGATIVE NEGATIVE mg/dL   Hgb urine dipstick NEGATIVE NEGATIVE   Bilirubin Urine NEGATIVE NEGATIVE   Ketones, ur NEGATIVE NEGATIVE mg/dL   Protein, ur NEGATIVE NEGATIVE mg/dL   Nitrite NEGATIVE NEGATIVE   Leukocytes,Ua NEGATIVE NEGATIVE  CBC with Differential/Platelet     Status: Abnormal   Collection Time: 01/02/23  6:59 PM  Result Value Ref Range   WBC 11.8 (H) 4.0 - 10.5 K/uL   RBC 4.25 3.87 - 5.11 MIL/uL   Hemoglobin 10.6 (L) 12.0 - 15.0 g/dL   HCT 00.3 (L) 70.4 - 88.8 %   MCV 76.2 (L) 80.0 - 100.0 fL   MCH 24.9 (L) 26.0 - 34.0 pg   MCHC 32.7 30.0 - 36.0  g/dL   RDW 91.6 94.5 - 03.8 %   Platelets 311 150 - 400 K/uL   nRBC 0.0 0.0 - 0.2 %   Neutrophils Relative % 66 %   Neutro Abs 7.8 (H) 1.7 - 7.7 K/uL   Lymphocytes  Relative 23 %   Lymphs Abs 2.7 0.7 - 4.0 K/uL   Monocytes Relative 9 %   Monocytes Absolute 1.1 (H) 0.1 - 1.0 K/uL   Eosinophils Relative 1 %   Eosinophils Absolute 0.1 0.0 - 0.5 K/uL   Basophils Relative 0 %   Basophils Absolute 0.0 0.0 - 0.1 K/uL   Immature Granulocytes 1 %   Abs Immature Granulocytes 0.11 (H) 0.00 - 0.07 K/uL  Comprehensive metabolic panel     Status: Abnormal   Collection Time: 01/02/23  6:59 PM  Result Value Ref Range   Sodium 134 (L) 135 - 145 mmol/L   Potassium 3.4 (L) 3.5 - 5.1 mmol/L   Chloride 102 98 - 111 mmol/L   CO2 22 22 - 32 mmol/L   Glucose, Bld 110 (H) 70 - 99 mg/dL   BUN 7 6 - 20 mg/dL   Creatinine, Ser 8.82 0.44 - 1.00 mg/dL   Calcium 8.7 (L) 8.9 - 10.3 mg/dL   Total Protein 6.5 6.5 - 8.1 g/dL   Albumin 2.6 (L) 3.5 - 5.0 g/dL   AST 17 15 - 41 U/L   ALT 18 0 - 44 U/L   Alkaline Phosphatase 65 38 - 126 U/L   Total Bilirubin 0.1 (L) 0.3 - 1.2 mg/dL   GFR, Estimated >80 >03 mL/min   Anion gap 10 5 - 15    MDM - Transfer of care to S. Reita Cliche, CNM @ 8:18 PM Becky (Danella Deis) Suzie Portela, MSN, CNM  Center for [employer] ordered this encounter  Medications   polyethylene glycol (MIRALAX) 17 g packet    Sig: Take 17 g by mouth daily.    Dispense:  14 each    Refill:  0    Order Specific Question:   Supervising Provider    Answer:   Adam Phenix [[id number]]   docusate sodium (COLACE) 100 MG capsule    Sig: Take 1 capsule (100 mg total) by mouth 2 (two) times daily as needed.    Dispense:  30 capsule    Refill:  2    Order Specific Question:   Supervising Provider    Answer:   Adam Phenix [[id number]]   iron polysaccharides (NIFEREX) 150 MG capsule    Sig: Take 1 capsule (150 mg total) by mouth every other day.    Dispense:  15 capsule    Refill:  2    Order  Specific Question:   Supervising Provider    Answer:   Adam Phenix [[id number]]   Assessment and Plan   --32 y.o. G1P0 at [redacted]w[redacted]d  --FHT 152 by Doppler --Symptomatic anemia, initiate PO iron as written --Colace and Miralax PRN --CHTN, compliant with prescribed Procardia XL 30 mg --S/p outpatient eval with Cardio-Obstetrics team --Currently using fruit as primary food source --Reviewed timing of meals in second trimester, add clean protein for improved sustained blood sugar --Discharge home in stable condition with return precautions  Clayton Bibles, MSA, MSN, CNM Certified Nurse Midwife, [facility] for [employer], [facility]

## 2023-01-17 ENCOUNTER — Ambulatory Visit (HOSPITAL_BASED_OUTPATIENT_CLINIC_OR_DEPARTMENT_OTHER): Payer: Medicaid Other

## 2023-01-17 ENCOUNTER — Other Ambulatory Visit: Payer: Self-pay

## 2023-01-17 ENCOUNTER — Ambulatory Visit: Payer: Medicaid Other | Attending: Obstetrics

## 2023-01-17 VITALS — BP 133/74 | HR 90

## 2023-01-17 DIAGNOSIS — Z362 Encounter for other antenatal screening follow-up: Secondary | ICD-10-CM | POA: Diagnosis not present

## 2023-01-17 DIAGNOSIS — O99212 Obesity complicating pregnancy, second trimester: Secondary | ICD-10-CM

## 2023-01-17 DIAGNOSIS — Z3A25 25 weeks gestation of pregnancy: Secondary | ICD-10-CM

## 2023-01-17 DIAGNOSIS — O10912 Unspecified pre-existing hypertension complicating pregnancy, second trimester: Secondary | ICD-10-CM | POA: Diagnosis not present

## 2023-01-17 DIAGNOSIS — O360120 Maternal care for anti-D [Rh] antibodies, second trimester, not applicable or unspecified: Secondary | ICD-10-CM | POA: Diagnosis not present

## 2023-01-17 DIAGNOSIS — D563 Thalassemia minor: Secondary | ICD-10-CM

## 2023-01-17 DIAGNOSIS — O285 Abnormal chromosomal and genetic finding on antenatal screening of mother: Secondary | ICD-10-CM

## 2023-01-17 DIAGNOSIS — O0990 Supervision of high risk pregnancy, unspecified, unspecified trimester: Secondary | ICD-10-CM

## 2023-01-17 DIAGNOSIS — O10012 Pre-existing essential hypertension complicating pregnancy, second trimester: Secondary | ICD-10-CM

## 2023-01-23 ENCOUNTER — Ambulatory Visit (INDEPENDENT_AMBULATORY_CARE_PROVIDER_SITE_OTHER): Payer: Medicaid Other | Admitting: Family Medicine

## 2023-01-23 ENCOUNTER — Other Ambulatory Visit: Payer: Self-pay

## 2023-01-23 VITALS — BP 117/81 | HR 101 | Wt 364.0 lb

## 2023-01-23 DIAGNOSIS — Z3A25 25 weeks gestation of pregnancy: Secondary | ICD-10-CM

## 2023-01-23 DIAGNOSIS — O26892 Other specified pregnancy related conditions, second trimester: Secondary | ICD-10-CM

## 2023-01-23 DIAGNOSIS — O99212 Obesity complicating pregnancy, second trimester: Secondary | ICD-10-CM

## 2023-01-23 DIAGNOSIS — O0992 Supervision of high risk pregnancy, unspecified, second trimester: Secondary | ICD-10-CM

## 2023-01-23 DIAGNOSIS — O26899 Other specified pregnancy related conditions, unspecified trimester: Secondary | ICD-10-CM

## 2023-01-23 DIAGNOSIS — O0990 Supervision of high risk pregnancy, unspecified, unspecified trimester: Secondary | ICD-10-CM

## 2023-01-23 DIAGNOSIS — Z6791 Unspecified blood type, Rh negative: Secondary | ICD-10-CM

## 2023-01-23 DIAGNOSIS — O10913 Unspecified pre-existing hypertension complicating pregnancy, third trimester: Secondary | ICD-10-CM

## 2023-01-23 DIAGNOSIS — O10919 Unspecified pre-existing hypertension complicating pregnancy, unspecified trimester: Secondary | ICD-10-CM

## 2023-01-23 NOTE — Progress Notes (Signed)
   PRENATAL VISIT NOTE  Subjective:  Becky Porter is a 32 y.o. G1P0 at [redacted]w[redacted]d being seen today for ongoing prenatal care.  She is currently monitored for the following issues for this low-risk pregnancy and has Supervision of high risk pregnancy, antepartum; Chronic hypertension affecting pregnancy; Maternal morbid obesity, antepartum (HCC); Rh negative state in antepartum period; and Alpha thalassemia silent carrier on their problem list.  Patient reports no complaints.  Contractions: Not present. Vag. Bleeding: None.  Movement: Present. Denies leaking of fluid.   The following portions of the patient's history were reviewed and updated as appropriate: allergies, current medications, past family history, past medical history, past social history, past surgical history and problem list.   Objective:   Vitals:   01/23/23 0956 01/23/23 1003  BP: (!) 145/89 117/81  Pulse: (!) 103 (!) 101  Weight: (!) 364 lb (165.1 kg)     Fetal Status: Fetal Heart Rate (bpm): 151   Movement: Present     General:  Alert, oriented and cooperative. Patient is in no acute distress.  Skin: Skin is warm and dry. No rash noted.   Cardiovascular: Normal heart rate noted  Respiratory: Normal respiratory effort, no problems with respiration noted  Abdomen: Soft, gravid, appropriate for gestational age.  Pain/Pressure: Absent     Pelvic: Cervical exam deferred        Extremities: Normal range of motion.     Mental Status: Normal mood and affect. Normal behavior. Normal judgment and thought content.   Assessment and Plan:  Pregnancy: G1P0 at [redacted]w[redacted]d 1. Rh negative state in antepartum period Rhogam at next visit  2. Chronic hypertension affecting pregnancy BP is WNL on recheck  3. Maternal morbid obesity, antepartum (HCC) TWG=-11 lb (-4.99 kg)   4. Supervision of high risk pregnancy, antepartum Up to date Having back pain related lifting and movement at work per patient.  Has been given work restriction  letter Patient wants to be allowed to work part time  Preterm labor symptoms and general obstetric precautions including but not limited to vaginal bleeding, contractions, leaking of fluid and fetal movement were reviewed in detail with the patient. Please refer to After Visit Summary for other counseling recommendations.   Return in about 4 weeks (around 02/20/2023) for Mom+Baby Combined Care, Routine prenatal care.  Future Appointments  Date Time Provider Department Center  02/03/2023  9:20 AM Thomasene Ripple, DO [facility] None  02/08/2023  8:50 AM [facility]  02/08/2023  8:55 AM Sue Lush, FNP [facility]  02/15/2023  1:45 PM [facility] NURSE [facility]  02/15/2023  2:00 PM [facility] US1 [facility]  02/23/2023 10:55 AM Venora Maples, MD [facility]  03/09/2023  9:55 AM Venora Maples, MD [facility]  03/14/2023  2:45 PM [facility] NURSE [facility]  03/14/2023  3:00 PM [facility] US1 [facility]  03/23/2023 10:55 AM Crissie Reese, Mary Sella, MD [facility]    Federico Flake, MD

## 2023-01-23 NOTE — Patient Instructions (Addendum)
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

## 2023-01-26 ENCOUNTER — Inpatient Hospital Stay (HOSPITAL_COMMUNITY)
Admission: AD | Admit: 2023-01-26 | Discharge: 2023-01-26 | Disposition: A | Discharge status: Home / Self Care | Payer: Medicaid Other | Attending: Obstetrics & Gynecology | Admitting: Obstetrics & Gynecology

## 2023-01-26 ENCOUNTER — Other Ambulatory Visit: Payer: Self-pay

## 2023-01-26 ENCOUNTER — Encounter (HOSPITAL_COMMUNITY): Payer: Self-pay | Admitting: Obstetrics & Gynecology

## 2023-01-26 DIAGNOSIS — Z0371 Encounter for suspected problem with amniotic cavity and membrane ruled out: Secondary | ICD-10-CM | POA: Diagnosis not present

## 2023-01-26 DIAGNOSIS — Z3A26 26 weeks gestation of pregnancy: Secondary | ICD-10-CM

## 2023-01-26 LAB — WET PREP, GENITAL
Clue Cells Wet Prep HPF POC: NONE SEEN
Sperm: NONE SEEN
Trich, Wet Prep: NONE SEEN
WBC, Wet Prep HPF POC: <10 (ref <10)
Yeast Wet Prep HPF POC: NONE SEEN

## 2023-01-26 LAB — POCT FERN TEST: POCT Fern Test: NEGATIVE

## 2023-01-26 NOTE — MAU Note (Signed)
Becky Porter is a 32 y.o. at [redacted]w[redacted]d here in MAU reporting: she began leaking fluid this morning @ 0945, reports fluid is clear.  Reports last intercourse last night.  Denies VB.  Endorses +FM. LMP: NA Onset of complaint: today Pain score: 0 Vitals:   01/26/23 1006  BP: 136/82  Pulse: (!) 101  Temp: 98.4 F (36.9 C)  SpO2: 99%     [id number] Lab orders placed from triage:   None

## 2023-01-26 NOTE — MAU Provider Note (Signed)
Event Date/Time   First Provider Initiated Contact with Patient 01/26/23 1055       S: Ms. Becky Porter is a 32 y.o. G1P0 at [redacted]w[redacted]d  who presents to MAU today complaining of leaking of fluid since 0945. She denies vaginal bleeding. She denies contractions. She reports normal fetal movement.  She reports last intercourse was last night.  O: BP 131/83   Pulse 91   Temp 98.4 F (36.9 C) (Oral)   Ht 5\' 5"  (1.651 m)   Wt (!) 167.8 kg   LMP 07/18/2022   SpO2 99%   BMI 61.55 kg/m  GENERAL: Well-developed, well-nourished female in no acute distress.  HEAD: Normocephalic, atraumatic.  CHEST: Normal effort of breathing, regular heart rate ABDOMEN: Soft, nontender, gravid PELVIC: Normal external female genitalia. Vagina is pink and rugated. Cervix with normal contour, no lesions. Normal discharge.  No pooling.   Fetal Monitoring: Baseline: 140 Variability: moderate Accelerations: none Decelerations: none Contractions: none  Results for orders placed or performed during the hospital encounter of 01/26/23 (from the past 24 hour(s))  Wet prep, genital     Status: None   Collection Time: 01/26/23 10:55 AM   Specimen: Cervix  Result Value Ref Range   Yeast Wet Prep HPF POC NONE SEEN NONE SEEN   Trich, Wet Prep NONE SEEN NONE SEEN   Clue Cells Wet Prep HPF POC NONE SEEN NONE SEEN   WBC, Wet Prep HPF POC <10 <10   Sperm NONE SEEN    A: SIUP at [redacted]w[redacted]d  Membranes intact  P: -Discharge home in stable condition -Second trimester precautions discussed -Patient advised to follow-up with OB as scheduled  -Patient may return to MAU as needed or if her condition were to change or worsen  Rolm Bookbinder, [place] 01/26/2023 10:55 AM

## 2023-01-26 NOTE — Discharge Instructions (Signed)

## 2023-01-27 LAB — GC/CHLAMYDIA PROBE AMP (~~LOC~~) NOT AT ARMC
Chlamydia: NEGATIVE
Comment: NEGATIVE
Comment: NORMAL
Neisseria Gonorrhea: NEGATIVE

## 2023-01-31 ENCOUNTER — Encounter: Payer: Self-pay | Admitting: Family Medicine

## 2023-02-03 ENCOUNTER — Ambulatory Visit: Payer: Medicaid Other | Attending: Cardiology | Admitting: Cardiology

## 2023-02-03 ENCOUNTER — Encounter: Payer: Self-pay | Admitting: Cardiology

## 2023-02-03 VITALS — BP 154/89 | HR 102 | Ht 65.0 in | Wt 366.4 lb

## 2023-02-03 DIAGNOSIS — O10919 Unspecified pre-existing hypertension complicating pregnancy, unspecified trimester: Secondary | ICD-10-CM | POA: Diagnosis not present

## 2023-02-03 DIAGNOSIS — Z3A37 37 weeks gestation of pregnancy: Secondary | ICD-10-CM

## 2023-02-03 DIAGNOSIS — O99210 Obesity complicating pregnancy, unspecified trimester: Secondary | ICD-10-CM | POA: Diagnosis not present

## 2023-02-03 MED ORDER — NIFEDIPINE ER OSMOTIC RELEASE 60 MG PO TB24: 60.0000 mg | ORAL_TABLET | Freq: Every day | ORAL | 3 refills | Status: DC

## 2023-02-03 NOTE — Patient Instructions (Addendum)
Medication Instructions:  Your physician has recommended you make the following change in your medication:  START: Nifedipine 60 mg once daily  Please take your blood pressure daily for 2 weeks and send in a MyChart message. Please include heart rates.   HOW TO TAKE YOUR BLOOD PRESSURE: Rest 5 minutes before taking your blood pressure. Don't smoke or drink caffeinated beverages for at least 30 minutes before. Take your blood pressure before (not after) you eat. Sit comfortably with your back supported and both feet on the floor (don't cross your legs). Elevate your arm to heart level on a table or a desk. Use the proper sized cuff. It should fit smoothly and snugly around your bare upper arm. There should be enough room to slip a fingertip under the cuff. The bottom edge of the cuff should be 1 inch above the crease of the elbow. Ideally, take 3 measurements at one sitting and record the average.  *If you need a refill on your cardiac medications before your next appointment, please call your pharmacy*   Lab Work: None   Testing/Procedures: None   Follow-Up: At [facility], you and your health needs are our priority.  As part of our continuing mission to provide you with exceptional heart care, we have created designated Provider Care Teams.  These Care Teams include your primary Cardiologist (physician) and Advanced Practice Providers (APPs -  Physician Assistants and Nurse Practitioners) who all work together to provide you with the care you need, when you need it.   Your next appointment:    Week of June 1  Provider:   Thomasene Ripple, DO

## 2023-02-03 NOTE — Progress Notes (Signed)
Cardio-Obstetrics Clinic  New Evaluation  Date:  02/03/2023   ID:  Becky Porter, DOB 1991-07-23, MRN [id number]  PCP:  Aviva Kluver   [facility] Providers Cardiologist:  Thomasene Ripple, DO  Electrophysiologist:  None       Referring MD: No ref. provider found   Chief Complaint: I am doing okay  History of Present Illness:    Becky Porter is a 32 y.o. female [G1P0] who is being seen today for the evaluation of chronic hypertension pregnancy at the request of No ref. provider found.   Medical history include chronic hypertension, morbid obesity here today to be evaluated for elevated blood pressure.  At her last visit she had not started the nifedipine.  Her blood pressure was elevated that they encouraged her to pick up her blood pressure.  She had a cuff and asked her to use her blood pressure cuff for monitoring and send me updates on her blood pressure.  She is here with her cuff today in the office.  [redacted] weeks pregnant  Prior CV Studies Reviewed: The following studies were reviewed today: None available  Past Medical History:  Diagnosis Date   Hypertension     No past surgical history on file.    OB History     Gravida  1   Para      Term      Preterm      AB      Living         SAB      IAB      Ectopic      Multiple      Live Births                  Current Medications: Current Meds  Medication Sig   aspirin EC 81 MG tablet Take 1 tablet (81 mg total) by mouth daily. Start taking when you are [redacted] weeks pregnant for rest of pregnancy for prevention of preeclampsia   aspirin-acetaminophen-caffeine (EXCEDRIN MIGRAINE) 250-250-65 MG tablet Take 2 tablets by mouth every 6 (six) hours as needed for headache.   cyclobenzaprine (FLEXERIL) 10 MG tablet Take 1 tablet (10 mg total) by mouth every 8 (eight) hours as needed for muscle spasms.   docusate sodium (COLACE) 100 MG capsule Take 1 capsule (100 mg total) by mouth 2 (two) times  daily as needed.   escitalopram (LEXAPRO) 20 MG tablet Take 20 mg by mouth as needed (depression).   iron polysaccharides (NIFEREX) 150 MG capsule Take 1 capsule (150 mg total) by mouth every other day.   Magnesium 400 MG CAPS Take 1 capsule by mouth daily.   NIFEdipine (PROCARDIA XL/NIFEDICAL XL) 60 MG 24 hr tablet Take 1 tablet (60 mg total) by mouth daily.   polyethylene glycol (MIRALAX) 17 g packet Take 17 g by mouth daily.   prenatal vitamin w/FE, FA (PRENATAL 1 + 1) 27-1 MG TABS tablet Take 1 tablet by mouth daily at 12 noon.   [DISCONTINUED] NIFEdipine (PROCARDIA-XL/NIFEDICAL-XL) 30 MG 24 hr tablet Take 1 tablet (30 mg total) by mouth daily.     Allergies:   Patient has no known allergies.   Social History   Socioeconomic History   Marital status: Single    Spouse name: Not on file   Number of children: Not on file   Years of education: Not on file   Highest education level: Some college, no degree  Occupational History   Not on file  Tobacco Use  Smoking status: Never   Smokeless tobacco: Not on file  Vaping Use   Vaping Use: Never used  Substance and Sexual Activity   Alcohol use: Not Currently    Comment: social   Drug use: No   Sexual activity: Yes    Birth control/protection: None  Other Topics Concern   Not on file  Social History Narrative   Not on file   Social Determinants of Health   Financial Resource Strain: Medium Risk (01/23/2023)   Overall Financial Resource Strain (CARDIA)    Difficulty of Paying Living Expenses: Somewhat hard  Food Insecurity: Food Insecurity Present (01/23/2023)   Hunger Vital Sign    Worried About Running Out of Food in the Last Year: Sometimes true    Ran Out of Food in the Last Year: Sometimes true  Transportation Needs: No Transportation Needs (01/23/2023)   PRAPARE - Administrator, Civil Service (Medical): No    Lack of Transportation (Non-Medical): No  Recent Concern: Transportation Needs - Unmet  Transportation Needs (01/23/2023)   PRAPARE - Transportation    Lack of Transportation (Medical): No    Lack of Transportation (Non-Medical): Yes  Physical Activity: Insufficiently Active (01/23/2023)   Exercise Vital Sign    Days of Exercise per Week: 3 days    Minutes of Exercise per Session: 10 min  Stress: Stress Concern Present (01/23/2023)   [employer] of Occupational Health - Occupational Stress Questionnaire    Feeling of Stress : Rather much  Social Connections: Moderately Isolated (01/23/2023)   Social Connection and Isolation Panel [NHANES]    Frequency of Communication with Friends and Family: More than three times a week    Frequency of Social Gatherings with Friends and Family: Twice a week    Attends Religious Services: 1 to 4 times per year    Active Member of [employer] or Organizations: No    Attends Engineer, structural: Not on file    Marital Status: Never married      Family History  Problem Relation Age of Onset   Kidney disease Father    Diabetes Maternal Grandmother    Stroke Maternal Grandmother    Stroke Paternal Grandmother    Cancer Paternal Grandfather    Asthma Neg Hx       ROS:   Please see the history of present illness.     All other systems reviewed and are negative.   Labs/EKG Reviewed:    EKG:   EKG is was ordered today.  The ekg ordered today demonstrates sinus rhythm with arrhythmia.  Heart rate 95 bpm.  Recent Labs: 01/02/2023: ALT 18; BUN 7; Creatinine, Ser 0.76; Hemoglobin 10.6; Platelets 311; Potassium 3.4; Sodium 134   Recent Lipid Panel No results found for: "CHOL", "TRIG", "HDL", "CHOLHDL", "LDLCALC", "LDLDIRECT"  Physical Exam:    VS:  BP (!) 154/89   Pulse (!) 102   Ht 5\' 5"  (1.651 m)   Wt (!) 366 lb 6.4 oz (166.2 kg)   LMP 07/18/2022   SpO2 98%   BMI 60.97 kg/m     Wt Readings from Last 3 Encounters:  02/03/23 (!) 366 lb 6.4 oz (166.2 kg)  01/26/23 (!) 369 lb 14.4 oz (167.8 kg)  01/23/23 (!) 364 lb  (165.1 kg)     GEN:  Well nourished, well developed in no acute distress HEENT: Normal NECK: No JVD; No carotid bruits LYMPHATICS: No lymphadenopathy CARDIAC: RRR, no murmurs, rubs, gallops RESPIRATORY:  Clear to auscultation without rales,  wheezing or rhonchi  ABDOMEN: Soft, non-tender, non-distended MUSCULOSKELETAL:  No edema; No deformity  SKIN: Warm and dry NEUROLOGIC:  Alert and oriented x 3 PSYCHIATRIC:  Normal affect    Risk Assessment/Risk Calculators:                  ASSESSMENT & PLAN:    Her blood pressure is elevated in the office.  Will increase her nifedipine to 60 mg once daily.  She is on aspirin 81 mg daily for preeclampsia prophylaxis we will continue that.  Patient Instructions  Medication Instructions:  Your physician has recommended you make the following change in your medication:  START: Nifedipine 60 mg once daily  Please take your blood pressure daily for 2 weeks and send in a MyChart message. Please include heart rates.   HOW TO TAKE YOUR BLOOD PRESSURE: Rest 5 minutes before taking your blood pressure. Don't smoke or drink caffeinated beverages for at least 30 minutes before. Take your blood pressure before (not after) you eat. Sit comfortably with your back supported and both feet on the floor (don't cross your legs). Elevate your arm to heart level on a table or a desk. Use the proper sized cuff. It should fit smoothly and snugly around your bare upper arm. There should be enough room to slip a fingertip under the cuff. The bottom edge of the cuff should be 1 inch above the crease of the elbow. Ideally, take 3 measurements at one sitting and record the average.  *If you need a refill on your cardiac medications before your next appointment, please call your pharmacy*   Lab Work: None   Testing/Procedures: None   Follow-Up: At [facility], you and your health needs are our priority.  As part of our continuing mission to  provide you with exceptional heart care, we have created designated Provider Care Teams.  These Care Teams include your primary Cardiologist (physician) and Advanced Practice Providers (APPs -  Physician Assistants and Nurse Practitioners) who all work together to provide you with the care you need, when you need it.   Your next appointment:    Week of June 1  Provider:   Thomasene Ripple, DO    Dispo:  No follow-ups on file.   Medication Adjustments/Labs and Tests Ordered: Current medicines are reviewed at length with the patient today.  Concerns regarding medicines are outlined above.  Tests Ordered: No orders of the defined types were placed in this encounter.  Medication Changes: Meds ordered this encounter  Medications   NIFEdipine (PROCARDIA XL/NIFEDICAL XL) 60 MG 24 hr tablet    Sig: Take 1 tablet (60 mg total) by mouth daily.    Dispense:  90 tablet    Refill:  3

## 2023-02-05 ENCOUNTER — Encounter: Payer: Self-pay | Admitting: Obstetrics and Gynecology

## 2023-02-05 NOTE — Progress Notes (Signed)
Received call from [employer] that patient had BP of 155/90. Consistent with what her BP was on Friday in office, sent message to patient asking if she had picked up new Rx and to take meds as directed.   Baldemar Lenis, MD, [facility] Attending Center for [employer] [facility])

## 2023-02-08 ENCOUNTER — Other Ambulatory Visit: Payer: Self-pay

## 2023-02-08 ENCOUNTER — Other Ambulatory Visit: Payer: Medicaid Other

## 2023-02-08 ENCOUNTER — Ambulatory Visit (INDEPENDENT_AMBULATORY_CARE_PROVIDER_SITE_OTHER): Payer: Medicaid Other | Admitting: Obstetrics and Gynecology

## 2023-02-08 VITALS — BP 135/82 | HR 103 | Wt 365.6 lb

## 2023-02-08 DIAGNOSIS — O10919 Unspecified pre-existing hypertension complicating pregnancy, unspecified trimester: Secondary | ICD-10-CM

## 2023-02-08 DIAGNOSIS — Z6791 Unspecified blood type, Rh negative: Secondary | ICD-10-CM

## 2023-02-08 DIAGNOSIS — O0990 Supervision of high risk pregnancy, unspecified, unspecified trimester: Secondary | ICD-10-CM

## 2023-02-08 DIAGNOSIS — Z23 Encounter for immunization: Secondary | ICD-10-CM | POA: Diagnosis not present

## 2023-02-08 DIAGNOSIS — O26893 Other specified pregnancy related conditions, third trimester: Secondary | ICD-10-CM | POA: Diagnosis not present

## 2023-02-08 DIAGNOSIS — O99213 Obesity complicating pregnancy, third trimester: Secondary | ICD-10-CM

## 2023-02-08 DIAGNOSIS — O10913 Unspecified pre-existing hypertension complicating pregnancy, third trimester: Secondary | ICD-10-CM

## 2023-02-08 DIAGNOSIS — Z3A28 28 weeks gestation of pregnancy: Secondary | ICD-10-CM | POA: Diagnosis not present

## 2023-02-08 DIAGNOSIS — O0993 Supervision of high risk pregnancy, unspecified, third trimester: Secondary | ICD-10-CM

## 2023-02-08 DIAGNOSIS — O99210 Obesity complicating pregnancy, unspecified trimester: Secondary | ICD-10-CM

## 2023-02-08 MED ORDER — ASPIRIN 81 MG PO CHEW
81.0000 mg | CHEWABLE_TABLET | Freq: Every day | ORAL | 3 refills | Status: DC
Start: 2023-02-08 — End: 2023-04-17

## 2023-02-08 MED ORDER — RHO D IMMUNE GLOBULIN 1500 UNITS IM SOSY: 300.0000 ug | PREFILLED_SYRINGE | Freq: Once | INTRAMUSCULAR | 0 refills | Status: DC

## 2023-02-08 MED ORDER — RHO D IMMUNE GLOBULIN 1500 UNIT/2ML IJ SOSY: 300.0000 ug | PREFILLED_SYRINGE | Freq: Once | INTRAMUSCULAR | Status: DC

## 2023-02-08 NOTE — Progress Notes (Signed)
   Subjective:  Becky Porter is a 32 y.o. G1P0 at [redacted]w[redacted]d being seen today for ongoing prenatal care.  She is currently monitored for the following issues for this high-risk pregnancy and has Supervision of high risk pregnancy, antepartum; Chronic hypertension affecting pregnancy; Maternal morbid obesity, antepartum (HCC); Rh negative state in antepartum period; and Alpha thalassemia silent carrier on their problem list.  Patient reports no complaints.  Contractions: Not present. Vag. Bleeding: None.  Movement: Present. Denies leaking of fluid, vaginal bleeding, contractions  The following portions of the patient's history were reviewed and updated as appropriate: allergies, current medications, past family history, past medical history, past social history, past surgical history and problem list. Problem list updated.  Objective:   Vitals:   02/08/23 0930  BP: 135/82  Pulse: (!) 103  Weight: (!) 365 lb 9.6 oz (165.8 kg)    Fetal Status: Fetal Heart Rate (bpm): 138   Movement: Present     General:  Alert, oriented and cooperative. Patient is in no acute distress.  Skin: Skin is warm and dry. No rash noted.   Cardiovascular: Normal heart rate noted  Respiratory: Normal respiratory effort, no problems with respiration noted  Abdomen: Soft, gravid, appropriate for gestational age. Pain/Pressure: Present     Pelvic: Vag. Bleeding: None     Cervical exam deferred        Extremities: Normal range of motion.     Mental Status: Normal mood and affect. Normal behavior. Normal judgment and thought content.   Urinalysis:      Assessment and Plan:  Pregnancy: G1P0 at [redacted]w[redacted]d  1. Supervision of high risk pregnancy, antepartum Routine labs, tdap, rhogam and GTT today Seen in mau 5/2 LOF, membranes intact Doing well today, feeling vigorous movement Questions about birth plan and epidural, discussed in detail  2. Rh negative state in antepartum period Rhogam administered today  3. Chronic  hypertension affecting pregnancy Normotensive on Nifedipine XL 60mg  daily, followed by cardiology Continue ASA EFW 29% on 4/23, WNL Following w/ MFM, next u/s 5/22  4. Maternal morbid obesity, antepartum [facility]) Following w/ MFM   Preterm labor symptoms and general obstetric precautions including but not limited to vaginal bleeding, contractions, leaking of fluid and fetal movement were reviewed in detail with the patient. Please refer to After Visit Summary for other counseling recommendations.  Future Appointments  Date Time Provider Department Center  02/15/2023  1:45 PM [facility] NURSE [facility]  02/15/2023  2:00 PM [facility] US1 [facility]  02/23/2023 10:55 AM Venora Maples, MD [facility]  03/01/2023  8:20 AM Thomasene Ripple, DO [facility] None  03/09/2023  9:55 AM Venora Maples, MD [facility]  03/14/2023  2:45 PM [facility] NURSE [facility]  03/14/2023  3:00 PM [facility] US1 [facility]  03/23/2023 10:55 AM Crissie Reese, Mary Sella, MD [facility]     Sue Lush, FNP

## 2023-02-08 NOTE — Progress Notes (Signed)
Gave Hyper Rho medicine for RH Neg status in LUOQ- pt tolerated well.  NDC: 16109-604-54 Lot: [id number] Exp 10/31/2023  Charlene Brooke

## 2023-02-09 LAB — GLUCOSE TOLERANCE, 2 HOURS W/ 1HR
Glucose, 1 hour: 101 mg/dL (ref 70–179)
Glucose, 2 hour: 120 mg/dL (ref 70–152)
Glucose, Fasting: 78 mg/dL (ref 70–91)

## 2023-02-09 LAB — CBC
Hematocrit: 35.5 % (ref 34.0–46.6)
Hemoglobin: 11.4 g/dL (ref 11.1–15.9)
MCH: 24.4 pg — ABNORMAL LOW (ref 26.6–33.0)
MCHC: 32.1 g/dL (ref 31.5–35.7)
MCV: 76 fL — ABNORMAL LOW (ref 79–97)
Platelets: 322 10*3/uL (ref 150–450)
RBC: 4.67 x10E6/uL (ref 3.77–5.28)
RDW: 13.2 % (ref 11.7–15.4)
WBC: 8.6 10*3/uL (ref 3.4–10.8)

## 2023-02-09 LAB — HIV ANTIBODY (ROUTINE TESTING W REFLEX): HIV Screen 4th Generation wRfx: NONREACTIVE

## 2023-02-09 LAB — ANTIBODY SCREEN: Antibody Screen: NEGATIVE

## 2023-02-09 LAB — RPR: RPR Ser Ql: NONREACTIVE

## 2023-02-12 ENCOUNTER — Inpatient Hospital Stay (HOSPITAL_COMMUNITY)
Admission: AD | Admit: 2023-02-12 | Discharge: 2023-02-12 | Disposition: A | Discharge status: Home / Self Care | Payer: Medicaid Other | Attending: Obstetrics & Gynecology | Admitting: Obstetrics & Gynecology

## 2023-02-12 ENCOUNTER — Encounter (HOSPITAL_COMMUNITY): Payer: Self-pay | Admitting: Obstetrics & Gynecology

## 2023-02-12 ENCOUNTER — Telehealth: Payer: Self-pay | Admitting: Obstetrics & Gynecology

## 2023-02-12 DIAGNOSIS — Z3689 Encounter for other specified antenatal screening: Secondary | ICD-10-CM | POA: Diagnosis not present

## 2023-02-12 DIAGNOSIS — R7989 Other specified abnormal findings of blood chemistry: Secondary | ICD-10-CM | POA: Insufficient documentation

## 2023-02-12 DIAGNOSIS — O26893 Other specified pregnancy related conditions, third trimester: Secondary | ICD-10-CM | POA: Diagnosis not present

## 2023-02-12 DIAGNOSIS — O10919 Unspecified pre-existing hypertension complicating pregnancy, unspecified trimester: Secondary | ICD-10-CM

## 2023-02-12 DIAGNOSIS — O10913 Unspecified pre-existing hypertension complicating pregnancy, third trimester: Secondary | ICD-10-CM | POA: Diagnosis not present

## 2023-02-12 DIAGNOSIS — Z3A28 28 weeks gestation of pregnancy: Secondary | ICD-10-CM | POA: Diagnosis not present

## 2023-02-12 LAB — COMPREHENSIVE METABOLIC PANEL
ALT: 18 U/L (ref 0–44)
AST: 12 U/L — ABNORMAL LOW (ref 15–41)
Albumin: 2.5 g/dL — ABNORMAL LOW (ref 3.5–5.0)
Alkaline Phosphatase: 79 U/L (ref 38–126)
Anion gap: 8 (ref 5–15)
BUN: 10 mg/dL (ref 6–20)
CO2: 23 mmol/L (ref 22–32)
Calcium: 8.6 mg/dL — ABNORMAL LOW (ref 8.9–10.3)
Chloride: 104 mmol/L (ref 98–111)
Creatinine, Ser: 1.08 mg/dL — ABNORMAL HIGH (ref 0.44–1.00)
GFR, Estimated: >60 mL/min (ref >60)
Glucose, Bld: 93 mg/dL (ref 70–99)
Potassium: 3.6 mmol/L (ref 3.5–5.1)
Sodium: 135 mmol/L (ref 135–145)
Total Bilirubin: 0.5 mg/dL (ref 0.3–1.2)
Total Protein: 6.8 g/dL (ref 6.5–8.1)

## 2023-02-12 LAB — PROTEIN / CREATININE RATIO, URINE
Creatinine, Urine: 236 mg/dL
Protein Creatinine Ratio: 0.1 mg/mg{creat} (ref 0.00–0.15)
Total Protein, Urine: 23 mg/dL

## 2023-02-12 LAB — CBC
HCT: 36.1 % (ref 36.0–46.0)
Hemoglobin: 11.5 g/dL — ABNORMAL LOW (ref 12.0–15.0)
MCH: 24.7 pg — ABNORMAL LOW (ref 26.0–34.0)
MCHC: 31.9 g/dL (ref 30.0–36.0)
MCV: 77.6 fL — ABNORMAL LOW (ref 80.0–100.0)
Platelets: 355 10*3/uL (ref 150–400)
RBC: 4.65 MIL/uL (ref 3.87–5.11)
RDW: 13.7 % (ref 11.5–15.5)
WBC: 11 10*3/uL — ABNORMAL HIGH (ref 4.0–10.5)
nRBC: 0 % (ref 0.0–0.2)

## 2023-02-12 NOTE — MAU Provider Note (Signed)
History     CSN: [id number]  Arrival date and time: 02/12/23 1728   Event Date/Time   First Provider Initiated Contact with Patient 02/12/23 1816      Chief Complaint  Patient presents with   Hypertension   32 y.o. G1 @28 .5 wks with CHTN presents with elevated BP at home. Reports BP of 150/? and 180/?. Denies HA, visual disturbances, RUQ pain, SOB, and CP. States she took her Procardia this am.       OB History     Gravida  1   Para      Term      Preterm      AB      Living         SAB      IAB      Ectopic      Multiple      Live Births              Past Medical History:  Diagnosis Date   Hypertension     History reviewed. No pertinent surgical history.  Family History  Problem Relation Age of Onset   Kidney disease Father    Diabetes Maternal Grandmother    Stroke Maternal Grandmother    Stroke Paternal Grandmother    Cancer Paternal Grandfather    Asthma Neg Hx     Social History   Tobacco Use   Smoking status: Never  Vaping Use   Vaping Use: Never used  Substance Use Topics   Alcohol use: Not Currently    Comment: social   Drug use: No    Allergies: No Known Allergies  No medications prior to admission.    Review of Systems  Eyes:  Negative for visual disturbance.  Respiratory:  Negative for shortness of breath.   Cardiovascular:  Negative for chest pain.  Gastrointestinal:  Negative for abdominal pain.  Neurological:  Negative for headaches.   Physical Exam   Blood pressure 125/81, pulse (!) 113, temperature 98 F (36.7 C), temperature source Oral, resp. rate 17, height 5\' 5"  (1.651 m), weight (!) 165.6 kg, last menstrual period 07/18/2022, SpO2 99 %.  Patient Vitals for the past 24 hrs:  BP Temp Temp src Pulse Resp SpO2 Height Weight  02/12/23 1905 -- -- -- -- -- 99 % -- --  02/12/23 1900 125/81 -- -- (!) 113 -- 99 % -- --  02/12/23 1854 -- -- -- -- -- 99 % -- --  02/12/23 1850 -- -- -- -- -- 99 % -- --   02/12/23 1845 122/68 -- -- (!) 106 -- 99 % -- --  02/12/23 1840 -- -- -- -- -- 99 % -- --  02/12/23 1835 -- -- -- -- -- 99 % -- --  02/12/23 1830 129/70 -- -- 98 -- 99 % -- --  02/12/23 1825 -- -- -- -- -- 99 % -- --  02/12/23 1820 -- -- -- -- -- 99 % -- --  02/12/23 1815 124/71 -- -- (!) 113 -- 100 % -- --  02/12/23 1810 -- -- -- -- -- 99 % -- --  02/12/23 1806 126/74 98 F (36.7 C) Oral (!) 112 17 99 % -- --  02/12/23 1751 -- -- -- -- -- -- 5\' 5"  (1.651 m) (!) 165.6 kg    Physical Exam Vitals and nursing note reviewed.  Constitutional:      General: She is not in acute distress.    Appearance: Normal appearance.  HENT:  Head: Normocephalic and atraumatic.  Cardiovascular:     Rate and Rhythm: Normal rate.  Pulmonary:     Effort: Pulmonary effort is normal. No respiratory distress.  Musculoskeletal:        General: Normal range of motion.     Cervical back: Normal range of motion.  Neurological:     General: No focal deficit present.     Mental Status: She is alert and oriented to person, place, and time.  Psychiatric:        Mood and Affect: Mood normal.        Behavior: Behavior normal.   EFM: 150 bpm, mod variability, + accels, no decels Toco: none  Results for orders placed or performed during the hospital encounter of 02/12/23 (from the past 24 hour(s))  CBC     Status: Abnormal   Collection Time: 02/12/23  5:53 PM  Result Value Ref Range   WBC 11.0 (H) 4.0 - 10.5 K/uL   RBC 4.65 3.87 - 5.11 MIL/uL   Hemoglobin 11.5 (L) 12.0 - 15.0 g/dL   HCT 16.1 09.6 - 04.5 %   MCV 77.6 (L) 80.0 - 100.0 fL   MCH 24.7 (L) 26.0 - 34.0 pg   MCHC 31.9 30.0 - 36.0 g/dL   RDW 40.9 81.1 - 91.4 %   Platelets 355 150 - 400 K/uL   nRBC 0.0 0.0 - 0.2 %  Comprehensive metabolic panel     Status: Abnormal   Collection Time: 02/12/23  5:53 PM  Result Value Ref Range   Sodium 135 135 - 145 mmol/L   Potassium 3.6 3.5 - 5.1 mmol/L   Chloride 104 98 - 111 mmol/L   CO2 23 22 - 32  mmol/L   Glucose, Bld 93 70 - 99 mg/dL   BUN 10 6 - 20 mg/dL   Creatinine, Ser 7.82 (H) 0.44 - 1.00 mg/dL   Calcium 8.6 (L) 8.9 - 10.3 mg/dL   Total Protein 6.8 6.5 - 8.1 g/dL   Albumin 2.5 (L) 3.5 - 5.0 g/dL   AST 12 (L) 15 - 41 U/L   ALT 18 0 - 44 U/L   Alkaline Phosphatase 79 38 - 126 U/L   Total Bilirubin 0.5 0.3 - 1.2 mg/dL   GFR, Estimated >95 >62 mL/min   Anion gap 8 5 - 15  Protein / creatinine ratio, urine     Status: None   Collection Time: 02/12/23  6:07 PM  Result Value Ref Range   Creatinine, Urine 236 mg/dL   Total Protein, Urine 23 mg/dL   Protein Creatinine Ratio 0.10 0.00 - 0.15 mg/mg[Cre]    MAU Course  Procedures  MDM Labs ordered and reviewed. No signs of si PEC however Ct is elevated. Discussed with Dr. Charlotta Newton, rec weekly labs and ROB appt this week. Message sent to clinic for appt. Discussed plan with pt. Instructed to bring home cuff with her to verify it fits and accurate. Stable for discharge home.   Assessment and Plan   1. [redacted] weeks gestation of pregnancy   2. NST (non-stress test) reactive   3. Chronic hypertension affecting pregnancy   4. Elevated serum creatinine    Discharge home Follow up at [facility] and MFM this week PEC precautions  Allergies as of 02/12/2023   No Known Allergies      Medication List     TAKE these medications    aspirin EC 81 MG tablet Take 1 tablet (81 mg total) by mouth daily. Start taking  when you are [redacted] weeks pregnant for rest of pregnancy for prevention of preeclampsia   aspirin 81 MG chewable tablet Chew 1 tablet (81 mg total) by mouth daily. Start after 12 weeks   cyclobenzaprine 10 MG tablet Commonly known as: FLEXERIL Take 1 tablet (10 mg total) by mouth every 8 (eight) hours as needed for muscle spasms.   docusate sodium 100 MG capsule Commonly known as: COLACE Take 1 capsule (100 mg total) by mouth 2 (two) times daily as needed.   escitalopram 20 MG tablet Commonly known as: LEXAPRO Take 20 mg by  mouth as needed (depression).   Excedrin Migraine 250-250-65 MG tablet Generic drug: aspirin-acetaminophen-caffeine Take 2 tablets by mouth every 6 (six) hours as needed for headache.   iron polysaccharides 150 MG capsule Commonly known as: NIFEREX Take 1 capsule (150 mg total) by mouth every other day.   Magnesium 400 MG Caps Take 1 capsule by mouth daily.   NIFEdipine 60 MG 24 hr tablet Commonly known as: PROCARDIA XL/NIFEDICAL XL Take 1 tablet (60 mg total) by mouth daily.   polyethylene glycol 17 g packet Commonly known as: MiraLax Take 17 g by mouth daily.   prenatal vitamin w/FE, FA 27-1 MG Tabs tablet Take 1 tablet by mouth daily at 12 noon.        Donette Larry, CNM 02/12/2023, 7:20 PM

## 2023-02-12 NOTE — MAU Note (Signed)
.  Becky Porter is a 32 y.o. at [redacted]w[redacted]d here in MAU reporting: when she got up today at 1 pm she didn't feel well (felt tired) so she checked her b/p and initial reading was 180"s/90's. Someone called her after that and told her to recheck in one hour, recheck was 150's/111. Denies bleeding or ROM denies abd pain. Reports baby is always less active during the day but she has only felt the baby move one time today. Denies headache, blurred vision or RUQ pain.   Onset of complaint: today Pain score: 0/10 Vitals:   02/12/23 1806  BP: 126/74  Pulse: (!) 112  Resp: 17  Temp: 98 F (36.7 C)  SpO2: 99%     FHT:150 Lab orders placed from triage:

## 2023-02-12 NOTE — Telephone Encounter (Signed)
Called pt.  States BP when she woke up was 150/130- repeat was a little lower.  Just took her home BP meds recently Advised repeat BP check 1-2 hrs after medication.  If elevated systolic >160 or diastolic >110 advised to come to MAU  Pt voiced understanding.  Has F/U appt on 5/22  Myna Hidalgo, DO Attending Obstetrician & Gynecologist, [facility] for [facility], [facility]

## 2023-02-14 ENCOUNTER — Ambulatory Visit: Payer: Medicaid Other

## 2023-02-14 DIAGNOSIS — Z3A28 28 weeks gestation of pregnancy: Secondary | ICD-10-CM | POA: Diagnosis not present

## 2023-02-14 DIAGNOSIS — Z6791 Unspecified blood type, Rh negative: Secondary | ICD-10-CM | POA: Diagnosis not present

## 2023-02-14 DIAGNOSIS — Z23 Encounter for immunization: Secondary | ICD-10-CM | POA: Diagnosis not present

## 2023-02-14 DIAGNOSIS — O26893 Other specified pregnancy related conditions, third trimester: Secondary | ICD-10-CM | POA: Diagnosis not present

## 2023-02-14 NOTE — Addendum Note (Signed)
Addended by: Isabell Jarvis on: 02/14/2023 04:40 PM   Modules accepted: Orders

## 2023-02-15 ENCOUNTER — Ambulatory Visit: Payer: Medicaid Other | Admitting: *Deleted

## 2023-02-15 ENCOUNTER — Ambulatory Visit (INDEPENDENT_AMBULATORY_CARE_PROVIDER_SITE_OTHER): Payer: Medicaid Other

## 2023-02-15 ENCOUNTER — Other Ambulatory Visit: Payer: Self-pay | Admitting: *Deleted

## 2023-02-15 ENCOUNTER — Ambulatory Visit: Payer: Medicaid Other | Attending: Maternal & Fetal Medicine

## 2023-02-15 VITALS — BP 111/63 | HR 99 | Ht 65.0 in | Wt 364.1 lb

## 2023-02-15 VITALS — BP 123/65 | HR 77

## 2023-02-15 DIAGNOSIS — O99213 Obesity complicating pregnancy, third trimester: Secondary | ICD-10-CM

## 2023-02-15 DIAGNOSIS — Z362 Encounter for other antenatal screening follow-up: Secondary | ICD-10-CM | POA: Insufficient documentation

## 2023-02-15 DIAGNOSIS — D563 Thalassemia minor: Secondary | ICD-10-CM

## 2023-02-15 DIAGNOSIS — O10912 Unspecified pre-existing hypertension complicating pregnancy, second trimester: Secondary | ICD-10-CM | POA: Insufficient documentation

## 2023-02-15 DIAGNOSIS — O360130 Maternal care for anti-D [Rh] antibodies, third trimester, not applicable or unspecified: Secondary | ICD-10-CM | POA: Diagnosis not present

## 2023-02-15 DIAGNOSIS — O285 Abnormal chromosomal and genetic finding on antenatal screening of mother: Secondary | ICD-10-CM | POA: Diagnosis not present

## 2023-02-15 DIAGNOSIS — O99212 Obesity complicating pregnancy, second trimester: Secondary | ICD-10-CM | POA: Insufficient documentation

## 2023-02-15 DIAGNOSIS — O10013 Pre-existing essential hypertension complicating pregnancy, third trimester: Secondary | ICD-10-CM

## 2023-02-15 DIAGNOSIS — Z0130 Encounter for examination of blood pressure without abnormal findings: Secondary | ICD-10-CM

## 2023-02-15 DIAGNOSIS — O0990 Supervision of high risk pregnancy, unspecified, unspecified trimester: Secondary | ICD-10-CM | POA: Insufficient documentation

## 2023-02-15 DIAGNOSIS — Z3A29 29 weeks gestation of pregnancy: Secondary | ICD-10-CM | POA: Diagnosis not present

## 2023-02-15 DIAGNOSIS — O10913 Unspecified pre-existing hypertension complicating pregnancy, third trimester: Secondary | ICD-10-CM

## 2023-02-15 NOTE — Progress Notes (Unsigned)
Pt here today for BP check.  Pt states that she is taking Nifedipine 30 mg daily.  Last dose was this morning.  Pt denies headaches and changes in vision.  Pt brought in home BP cuff.  Pt needs a larger cuff size as the one provided from pharmacy does not fit well.  Called [facility] and was informed that they do not have any larger size cuffs at this time.  BP taken with home bp cuff 142/93 RA.  Rpt BP LA  111/63 via dynamap.  Reviewed chart with Dr. Alysia Penna who recommends that pt obtain larger cuff to be able obtain at home BP, if not does not recommend pt to take BP at home.  Pt advised to obtain BP cuff and that I will f/u with [facility] to get larger cuff.  Pt encouraged at her OB visit on 02/23/23 to remind staff about larger BP cuff.  Pt informed to continue to take Nifedipine 30 mg daily, monitor for sx's of elevated BP, and also call the office with concerns.  Pt verbalized understanding with no further questions.   Becky Porter  02/15/23

## 2023-02-23 ENCOUNTER — Ambulatory Visit (INDEPENDENT_AMBULATORY_CARE_PROVIDER_SITE_OTHER): Payer: Medicaid Other | Admitting: Family Medicine

## 2023-02-23 ENCOUNTER — Encounter: Payer: Self-pay | Admitting: Family Medicine

## 2023-02-23 VITALS — BP 127/84 | HR 111 | Wt 367.0 lb

## 2023-02-23 DIAGNOSIS — O10919 Unspecified pre-existing hypertension complicating pregnancy, unspecified trimester: Secondary | ICD-10-CM | POA: Diagnosis not present

## 2023-02-23 DIAGNOSIS — Z6791 Unspecified blood type, Rh negative: Secondary | ICD-10-CM

## 2023-02-23 DIAGNOSIS — O26899 Other specified pregnancy related conditions, unspecified trimester: Secondary | ICD-10-CM

## 2023-02-23 DIAGNOSIS — Z3A30 30 weeks gestation of pregnancy: Secondary | ICD-10-CM

## 2023-02-23 DIAGNOSIS — O0990 Supervision of high risk pregnancy, unspecified, unspecified trimester: Secondary | ICD-10-CM

## 2023-02-23 DIAGNOSIS — O99210 Obesity complicating pregnancy, unspecified trimester: Secondary | ICD-10-CM

## 2023-02-23 NOTE — Addendum Note (Signed)
Addended by: Isabell Jarvis on: 02/23/2023 12:25 PM   Modules accepted: Orders

## 2023-02-23 NOTE — Progress Notes (Signed)
   Subjective:  Becky Porter is a 32 y.o. G1P0 at [redacted]w[redacted]d being seen today for ongoing prenatal care.  She is currently monitored for the following issues for this high-risk pregnancy and has Supervision of high risk pregnancy, antepartum; Chronic hypertension affecting pregnancy; Maternal morbid obesity, antepartum (HCC); Rh negative state in antepartum period; and Alpha thalassemia silent carrier on their problem list.  Patient reports no complaints.  Contractions: Not present. Vag. Bleeding: None.  Movement: Present. Denies leaking of fluid.   The following portions of the patient's history were reviewed and updated as appropriate: allergies, current medications, past family history, past medical history, past social history, past surgical history and problem list. Problem list updated.  Objective:   Vitals:   02/23/23 1140 02/23/23 1144  BP: (!) 144/91 127/84  Pulse: (!) 108 (!) 111  Weight: (!) 367 lb (166.5 kg)     Fetal Status: Fetal Heart Rate (bpm): 141   Movement: Present     General:  Alert, oriented and cooperative. Patient is in no acute distress.  Skin: Skin is warm and dry. No rash noted.   Cardiovascular: Normal heart rate noted  Respiratory: Normal respiratory effort, no problems with respiration noted  Abdomen: Soft, gravid, appropriate for gestational age. Pain/Pressure: Present     Pelvic: Vag. Bleeding: None     Cervical exam deferred        Extremities: Normal range of motion.     Mental Status: Normal mood and affect. Normal behavior. Normal judgment and thought content.   Urinalysis:      Assessment and Plan:  Pregnancy: G1P0 at [redacted]w[redacted]d  1. Supervision of high risk pregnancy, antepartum BP initially mild range, repeat normal FHR normal Discussed contraception, had IUD previously and liked it, plan for post placental hormonal IUD  2. Chronic hypertension affecting pregnancy Normotensive on Nifed 60 and taking asa Seen in [place] for elevated BP's on  02/12/2023, labs normal except for Creatinine which was elevated at 1.08, recheck PreE labs today Following w MFM Last growth [place] 02/15/2023, EFW 29%, AC 29%, AFI 11 Weekly BPPs starting at 32 weeks - Comprehensive metabolic panel  3. Rh negative state in antepartum period S/p rhogam on 02/08/2023  4. Maternal morbid obesity, antepartum (HCC)   Preterm labor symptoms and general obstetric precautions including but not limited to vaginal bleeding, contractions, leaking of fluid and fetal movement were reviewed in detail with the patient. Please refer to After Visit Summary for other counseling recommendations.  Return in 2 weeks (on 03/09/2023) for Dyad patient, ob visit.   Venora Maples, MD

## 2023-02-23 NOTE — Patient Instructions (Signed)

## 2023-02-24 LAB — COMPREHENSIVE METABOLIC PANEL
ALT: 23 IU/L (ref 0–32)
AST: 13 IU/L (ref 0–40)
Albumin/Globulin Ratio: 1 — ABNORMAL LOW (ref 1.2–2.2)
Albumin: 3.4 g/dL — ABNORMAL LOW (ref 3.9–4.9)
Alkaline Phosphatase: 118 IU/L (ref 44–121)
BUN/Creatinine Ratio: 11 (ref 9–23)
BUN: 6 mg/dL (ref 6–20)
Bilirubin Total: 0.3 mg/dL (ref 0.0–1.2)
CO2: 18 mmol/L — ABNORMAL LOW (ref 20–29)
Calcium: 8.8 mg/dL (ref 8.7–10.2)
Chloride: 106 mmol/L (ref 96–106)
Creatinine, Ser: 0.56 mg/dL — ABNORMAL LOW (ref 0.57–1.00)
Globulin, Total: 3.3 g/dL (ref 1.5–4.5)
Glucose: 92 mg/dL (ref 70–99)
Potassium: 3.8 mmol/L (ref 3.5–5.2)
Sodium: 137 mmol/L (ref 134–144)
Total Protein: 6.7 g/dL (ref 6.0–8.5)
eGFR: 124 mL/min/{1.73_m2} (ref >59)

## 2023-02-24 LAB — CBC
Hematocrit: 34.9 % (ref 34.0–46.6)
Hemoglobin: 11.5 g/dL (ref 11.1–15.9)
MCH: 24.5 pg — ABNORMAL LOW (ref 26.6–33.0)
MCHC: 33 g/dL (ref 31.5–35.7)
MCV: 74 fL — ABNORMAL LOW (ref 79–97)
Platelets: 335 10*3/uL (ref 150–450)
RBC: 4.69 x10E6/uL (ref 3.77–5.28)
RDW: 13.7 % (ref 11.7–15.4)
WBC: 11.4 10*3/uL — ABNORMAL HIGH (ref 3.4–10.8)

## 2023-02-24 LAB — PROTEIN / CREATININE RATIO, URINE
Creatinine, Urine: 252 mg/dL
Protein, Ur: 46.3 mg/dL
Protein/Creat Ratio: 184 mg/g creat (ref 0–200)

## 2023-03-01 ENCOUNTER — Ambulatory Visit: Payer: Medicaid Other | Attending: Cardiology | Admitting: Cardiology

## 2023-03-01 ENCOUNTER — Encounter: Payer: Self-pay | Admitting: Cardiology

## 2023-03-01 VITALS — BP 132/84 | HR 111 | Ht 65.0 in | Wt 369.0 lb

## 2023-03-01 DIAGNOSIS — O10919 Unspecified pre-existing hypertension complicating pregnancy, unspecified trimester: Secondary | ICD-10-CM | POA: Diagnosis not present

## 2023-03-01 DIAGNOSIS — O99210 Obesity complicating pregnancy, unspecified trimester: Secondary | ICD-10-CM | POA: Diagnosis not present

## 2023-03-01 DIAGNOSIS — Z3A31 31 weeks gestation of pregnancy: Secondary | ICD-10-CM

## 2023-03-01 NOTE — Patient Instructions (Signed)
Medication Instructions:  Your physician recommends that you continue on your current medications as directed. Please refer to the Current Medication list given to you today.  *If you need a refill on your cardiac medications before your next appointment, please call your pharmacy*   Lab Work: None   Testing/Procedures: None   Follow-Up: At [facility], you and your health needs are our priority.  As part of our continuing mission to provide you with exceptional heart care, we have created designated Provider Care Teams.  These Care Teams include your primary Cardiologist (physician) and Advanced Practice Providers (APPs -  Physician Assistants and Nurse Practitioners) who all work together to provide you with the care you need, when you need it.    Your next appointment:   4 week(s) - overbook   Provider:   Thomasene Ripple, DO

## 2023-03-01 NOTE — Progress Notes (Signed)
Cardio-Obstetrics Clinic  New Evaluation  Date:  03/01/2023   ID:  Becky Porter, DOB 1991-05-04, MRN [id number]  PCP:  Aviva Kluver   [facility] Providers Cardiologist:  Thomasene Ripple, DO  Electrophysiologist:  None       Referring MD: No ref. provider found   Chief Complaint: I am doing okay  History of Present Illness:    Becky Porter is a 32 y.o. female [G1P0] who is being seen today for the evaluation of chronic hypertension pregnancy at the request of No ref. provider found.   Medical history include chronic hypertension, morbid obesity here today to be evaluated for elevated blood pressure.   At her visit on Feb 03, 2023 to increase her nifedipine to 60 mg daily.  Will continue her aspirin 81 mg daily for preeclampsia prophylaxis.  [redacted] weeks pregnant..  Prior CV Studies Reviewed: The following studies were reviewed today: None available  Past Medical History:  Diagnosis Date   Hypertension     No past surgical history on file.    OB History     Gravida  1   Para      Term      Preterm      AB      Living         SAB      IAB      Ectopic      Multiple      Live Births                  Current Medications: Current Meds  Medication Sig   aspirin 81 MG chewable tablet Chew 1 tablet (81 mg total) by mouth daily. Start after 12 weeks   aspirin-acetaminophen-caffeine (EXCEDRIN MIGRAINE) 250-250-65 MG tablet Take 2 tablets by mouth every 6 (six) hours as needed for headache.   cyclobenzaprine (FLEXERIL) 10 MG tablet Take 1 tablet (10 mg total) by mouth every 8 (eight) hours as needed for muscle spasms.   docusate sodium (COLACE) 100 MG capsule Take 1 capsule (100 mg total) by mouth 2 (two) times daily as needed.   escitalopram (LEXAPRO) 20 MG tablet Take 20 mg by mouth as needed (depression).   iron polysaccharides (NIFEREX) 150 MG capsule Take 1 capsule (150 mg total) by mouth every other day.   Magnesium 400 MG CAPS Take 1  capsule by mouth daily.   NIFEdipine (PROCARDIA XL/NIFEDICAL XL) 60 MG 24 hr tablet Take 1 tablet (60 mg total) by mouth daily.   polyethylene glycol (MIRALAX) 17 g packet Take 17 g by mouth daily.   prenatal vitamin w/FE, FA (PRENATAL 1 + 1) 27-1 MG TABS tablet Take 1 tablet by mouth daily at 12 noon.     Allergies:   Patient has no known allergies.   Social History   Socioeconomic History   Marital status: Single    Spouse name: Not on file   Number of children: Not on file   Years of education: Not on file   Highest education level: Some college, no degree  Occupational History   Not on file  Tobacco Use   Smoking status: Never   Smokeless tobacco: Not on file  Vaping Use   Vaping Use: Never used  Substance and Sexual Activity   Alcohol use: Not Currently    Comment: social   Drug use: No   Sexual activity: Yes    Birth control/protection: None  Other Topics Concern   Not on file  Social History  Narrative   Not on file   Social Determinants of Health   Financial Resource Strain: Medium Risk (01/23/2023)   Overall Financial Resource Strain (CARDIA)    Difficulty of Paying Living Expenses: Somewhat hard  Food Insecurity: Food Insecurity Present (01/23/2023)   Hunger Vital Sign    Worried About Running Out of Food in the Last Year: Sometimes true    Ran Out of Food in the Last Year: Sometimes true  Transportation Needs: No Transportation Needs (01/23/2023)   PRAPARE - Administrator, Civil Service (Medical): No    Lack of Transportation (Non-Medical): No  Recent Concern: Transportation Needs - Unmet Transportation Needs (01/23/2023)   PRAPARE - Transportation    Lack of Transportation (Medical): No    Lack of Transportation (Non-Medical): Yes  Physical Activity: Insufficiently Active (01/23/2023)   Exercise Vital Sign    Days of Exercise per Week: 3 days    Minutes of Exercise per Session: 10 min  Stress: Stress Concern Present (01/23/2023)   [employer] of Occupational Health - Occupational Stress Questionnaire    Feeling of Stress : Rather much  Social Connections: Moderately Isolated (01/23/2023)   Social Connection and Isolation Panel [NHANES]    Frequency of Communication with Friends and Family: More than three times a week    Frequency of Social Gatherings with Friends and Family: Twice a week    Attends Religious Services: 1 to 4 times per year    Active Member of [employer] or Organizations: No    Attends Engineer, structural: Not on file    Marital Status: Never married      Family History  Problem Relation Age of Onset   Kidney disease Father    Diabetes Maternal Grandmother    Stroke Maternal Grandmother    Stroke Paternal Grandmother    Cancer Paternal Grandfather    Asthma Neg Hx       ROS:   Please see the history of present illness.     All other systems reviewed and are negative.   Labs/EKG Reviewed:    EKG:   EKG is was ordered today.  The ekg ordered today demonstrates sinus rhythm with arrhythmia.  Heart rate 95 bpm.  Recent Labs: 02/23/2023: ALT 23; BUN 6; Creatinine, Ser 0.56; Hemoglobin 11.5; Platelets 335; Potassium 3.8; Sodium 137   Recent Lipid Panel No results found for: "CHOL", "TRIG", "HDL", "CHOLHDL", "LDLCALC", "LDLDIRECT"  Physical Exam:    VS:  BP 132/84   Pulse (!) 111   Ht 5\' 5"  (1.651 m)   Wt (!) 369 lb (167.4 kg)   LMP 07/18/2022   SpO2 99%   BMI 61.40 kg/m     Wt Readings from Last 3 Encounters:  03/01/23 (!) 369 lb (167.4 kg)  02/23/23 (!) 367 lb (166.5 kg)  02/15/23 (!) 364 lb 1.6 oz (165.2 kg)     GEN:  Well nourished, well developed in no acute distress HEENT: Normal NECK: No JVD; No carotid bruits LYMPHATICS: No lymphadenopathy CARDIAC: RRR, no murmurs, rubs, gallops RESPIRATORY:  Clear to auscultation without rales, wheezing or rhonchi  ABDOMEN: Soft, non-tender, non-distended MUSCULOSKELETAL:  No edema; No deformity  SKIN: Warm and  dry NEUROLOGIC:  Alert and oriented x 3 PSYCHIATRIC:  Normal affect    Risk Assessment/Risk Calculators:                  ASSESSMENT & PLAN:    Chronic hypertension in pregnancy Obesity pregnancy   Her  blood pressure is at target in the office today.  Will continue her current therapy 60 mg daily. She is on aspirin 81 mg daily for preeclampsia prophylaxis we will continue that. Advised patient to keep weight gain between 11 to 20 pounds.   Patient Instructions  Medication Instructions:  Your physician recommends that you continue on your current medications as directed. Please refer to the Current Medication list given to you today.  *If you need a refill on your cardiac medications before your next appointment, please call your pharmacy*   Lab Work: None   Testing/Procedures: None   Follow-Up: At [facility], you and your health needs are our priority.  As part of our continuing mission to provide you with exceptional heart care, we have created designated Provider Care Teams.  These Care Teams include your primary Cardiologist (physician) and Advanced Practice Providers (APPs -  Physician Assistants and Nurse Practitioners) who all work together to provide you with the care you need, when you need it.    Your next appointment:   4 week(s) - overbook   Provider:   Thomasene Ripple, DO    Dispo:  No follow-ups on file.   Medication Adjustments/Labs and Tests Ordered: Current medicines are reviewed at length with the patient today.  Concerns regarding medicines are outlined above.  Tests Ordered: No orders of the defined types were placed in this encounter.  Medication Changes: No orders of the defined types were placed in this encounter.

## 2023-03-09 ENCOUNTER — Other Ambulatory Visit: Payer: Self-pay

## 2023-03-09 ENCOUNTER — Encounter: Payer: Self-pay | Admitting: Family Medicine

## 2023-03-09 ENCOUNTER — Ambulatory Visit (INDEPENDENT_AMBULATORY_CARE_PROVIDER_SITE_OTHER): Payer: Medicaid Other | Admitting: Family Medicine

## 2023-03-09 ENCOUNTER — Other Ambulatory Visit (INDEPENDENT_AMBULATORY_CARE_PROVIDER_SITE_OTHER): Payer: Medicaid Other

## 2023-03-09 VITALS — BP 122/87 | HR 92 | Wt 367.7 lb

## 2023-03-09 DIAGNOSIS — O99210 Obesity complicating pregnancy, unspecified trimester: Secondary | ICD-10-CM

## 2023-03-09 DIAGNOSIS — O99213 Obesity complicating pregnancy, third trimester: Secondary | ICD-10-CM

## 2023-03-09 DIAGNOSIS — O26899 Other specified pregnancy related conditions, unspecified trimester: Secondary | ICD-10-CM

## 2023-03-09 DIAGNOSIS — Z3A32 32 weeks gestation of pregnancy: Secondary | ICD-10-CM

## 2023-03-09 DIAGNOSIS — O10919 Unspecified pre-existing hypertension complicating pregnancy, unspecified trimester: Secondary | ICD-10-CM

## 2023-03-09 DIAGNOSIS — Z6791 Unspecified blood type, Rh negative: Secondary | ICD-10-CM

## 2023-03-09 DIAGNOSIS — O0990 Supervision of high risk pregnancy, unspecified, unspecified trimester: Secondary | ICD-10-CM

## 2023-03-09 NOTE — Progress Notes (Signed)
   Subjective:  Becky Porter is a 32 y.o. G1P0 at [redacted]w[redacted]d being seen today for ongoing prenatal care.  She is currently monitored for the following issues for this high-risk pregnancy and has Supervision of high risk pregnancy, antepartum; Chronic hypertension affecting pregnancy; Maternal morbid obesity, antepartum (HCC); Rh negative state in antepartum period; and Alpha thalassemia silent carrier on their problem list.  Patient reports no complaints.  Contractions: Not present. Vag. Bleeding: None.  Movement: Present. Denies leaking of fluid.   The following portions of the patient's history were reviewed and updated as appropriate: allergies, current medications, past family history, past medical history, past social history, past surgical history and problem list. Problem list updated.  Objective:   Vitals:   03/09/23 0955 03/09/23 1000  BP: (!) 134/91 122/87  Pulse: (!) 120 92  Weight: (!) 367 lb 11.2 oz (166.8 kg)     Fetal Status: Fetal Heart Rate (bpm): 145   Movement: Present     General:  Alert, oriented and cooperative. Patient is in no acute distress.  Skin: Skin is warm and dry. No rash noted.   Cardiovascular: Normal heart rate noted  Respiratory: Normal respiratory effort, no problems with respiration noted  Abdomen: Soft, gravid, appropriate for gestational age. Pain/Pressure: Present     Pelvic: Vag. Bleeding: None     Cervical exam deferred        Extremities: Normal range of motion.  Edema: Trace  Mental Status: Normal mood and affect. Normal behavior. Normal judgment and thought content.   Urinalysis:      Assessment and Plan:  Pregnancy: G1P0 at [redacted]w[redacted]d  1. Supervision of high risk pregnancy, antepartum BP normal on recheck FHR normal  2. Chronic hypertension affecting pregnancy Normotensive on Nifed 60, taking ASA Followgin w MFM Last growth [place] 02/15/2023, EFW 29%, AC 29%, AFI 11 Needs BPP this week, able to fit her in this morning, score 8/8 Already  scheduled out with MFM starting next week  3. Rh negative state in antepartum period S/p rhogam 02/08/2023  4. Maternal morbid obesity, antepartum (HCC)   Preterm labor symptoms and general obstetric precautions including but not limited to vaginal bleeding, contractions, leaking of fluid and fetal movement were reviewed in detail with the patient. Please refer to After Visit Summary for other counseling recommendations.  Return in 2 weeks (on 03/23/2023) for Dyad patient, ob visit.   Venora Maples, MD

## 2023-03-09 NOTE — Patient Instructions (Signed)

## 2023-03-14 ENCOUNTER — Other Ambulatory Visit: Payer: Self-pay | Admitting: Maternal & Fetal Medicine

## 2023-03-14 ENCOUNTER — Encounter: Payer: Self-pay | Admitting: *Deleted

## 2023-03-14 ENCOUNTER — Ambulatory Visit: Payer: Medicaid Other | Admitting: *Deleted

## 2023-03-14 ENCOUNTER — Ambulatory Visit: Payer: Medicaid Other | Attending: Maternal & Fetal Medicine

## 2023-03-14 VITALS — BP 122/81 | HR 108

## 2023-03-14 DIAGNOSIS — Z3A33 33 weeks gestation of pregnancy: Secondary | ICD-10-CM

## 2023-03-14 DIAGNOSIS — O285 Abnormal chromosomal and genetic finding on antenatal screening of mother: Secondary | ICD-10-CM

## 2023-03-14 DIAGNOSIS — O10013 Pre-existing essential hypertension complicating pregnancy, third trimester: Secondary | ICD-10-CM

## 2023-03-14 DIAGNOSIS — Z362 Encounter for other antenatal screening follow-up: Secondary | ICD-10-CM | POA: Diagnosis not present

## 2023-03-14 DIAGNOSIS — D563 Thalassemia minor: Secondary | ICD-10-CM

## 2023-03-14 DIAGNOSIS — O0990 Supervision of high risk pregnancy, unspecified, unspecified trimester: Secondary | ICD-10-CM | POA: Diagnosis not present

## 2023-03-14 DIAGNOSIS — O10912 Unspecified pre-existing hypertension complicating pregnancy, second trimester: Secondary | ICD-10-CM | POA: Insufficient documentation

## 2023-03-14 DIAGNOSIS — O360130 Maternal care for anti-D [Rh] antibodies, third trimester, not applicable or unspecified: Secondary | ICD-10-CM

## 2023-03-14 DIAGNOSIS — O99212 Obesity complicating pregnancy, second trimester: Secondary | ICD-10-CM

## 2023-03-14 DIAGNOSIS — O99213 Obesity complicating pregnancy, third trimester: Secondary | ICD-10-CM | POA: Diagnosis not present

## 2023-03-21 ENCOUNTER — Encounter: Payer: Self-pay | Admitting: *Deleted

## 2023-03-21 ENCOUNTER — Ambulatory Visit: Payer: Medicaid Other | Admitting: *Deleted

## 2023-03-21 ENCOUNTER — Ambulatory Visit: Payer: Medicaid Other | Attending: Obstetrics and Gynecology

## 2023-03-21 ENCOUNTER — Ambulatory Visit (INDEPENDENT_AMBULATORY_CARE_PROVIDER_SITE_OTHER): Payer: Medicaid Other | Admitting: Family Medicine

## 2023-03-21 ENCOUNTER — Other Ambulatory Visit: Payer: Self-pay | Admitting: *Deleted

## 2023-03-21 ENCOUNTER — Encounter: Payer: Self-pay | Admitting: Family Medicine

## 2023-03-21 VITALS — BP 138/84 | HR 105 | Wt 369.0 lb

## 2023-03-21 VITALS — BP 106/78 | HR 104

## 2023-03-21 DIAGNOSIS — O0990 Supervision of high risk pregnancy, unspecified, unspecified trimester: Secondary | ICD-10-CM | POA: Diagnosis not present

## 2023-03-21 DIAGNOSIS — O10913 Unspecified pre-existing hypertension complicating pregnancy, third trimester: Secondary | ICD-10-CM | POA: Insufficient documentation

## 2023-03-21 DIAGNOSIS — O360130 Maternal care for anti-D [Rh] antibodies, third trimester, not applicable or unspecified: Secondary | ICD-10-CM | POA: Diagnosis not present

## 2023-03-21 DIAGNOSIS — D563 Thalassemia minor: Secondary | ICD-10-CM | POA: Diagnosis not present

## 2023-03-21 DIAGNOSIS — O10013 Pre-existing essential hypertension complicating pregnancy, third trimester: Secondary | ICD-10-CM

## 2023-03-21 DIAGNOSIS — O10919 Unspecified pre-existing hypertension complicating pregnancy, unspecified trimester: Secondary | ICD-10-CM

## 2023-03-21 DIAGNOSIS — O26899 Other specified pregnancy related conditions, unspecified trimester: Secondary | ICD-10-CM

## 2023-03-21 DIAGNOSIS — O285 Abnormal chromosomal and genetic finding on antenatal screening of mother: Secondary | ICD-10-CM

## 2023-03-21 DIAGNOSIS — Z3A34 34 weeks gestation of pregnancy: Secondary | ICD-10-CM

## 2023-03-21 DIAGNOSIS — O99213 Obesity complicating pregnancy, third trimester: Secondary | ICD-10-CM

## 2023-03-21 DIAGNOSIS — O99210 Obesity complicating pregnancy, unspecified trimester: Secondary | ICD-10-CM

## 2023-03-21 DIAGNOSIS — Z6791 Unspecified blood type, Rh negative: Secondary | ICD-10-CM

## 2023-03-21 NOTE — Progress Notes (Signed)
   Subjective:  Becky Porter is a 32 y.o. G1P0 at [redacted]w[redacted]d being seen today for ongoing prenatal care.  She is currently monitored for the following issues for this high-risk pregnancy and has Supervision of high risk pregnancy, antepartum; Chronic hypertension affecting pregnancy; Maternal morbid obesity, antepartum (HCC); Rh negative state in antepartum period; and Alpha thalassemia silent carrier on their problem list.  Patient reports no complaints.  Contractions: Not present. Vag. Bleeding: None.  Movement: Present. Denies leaking of fluid.   The following portions of the patient's history were reviewed and updated as appropriate: allergies, current medications, past family history, past medical history, past social history, past surgical history and problem list. Problem list updated.  Objective:   Vitals:   03/21/23 1455  BP: 138/84  Pulse: (!) 105  Weight: (!) 369 lb (167.4 kg)    Fetal Status: Fetal Heart Rate (bpm): 135   Movement: Present     General:  Alert, oriented and cooperative. Patient is in no acute distress.  Skin: Skin is warm and dry. No rash noted.   Cardiovascular: Normal heart rate noted  Respiratory: Normal respiratory effort, no problems with respiration noted  Abdomen: Soft, gravid, appropriate for gestational age. Pain/Pressure: Present     Pelvic: Vag. Bleeding: None     Cervical exam deferred        Extremities: Normal range of motion.     Mental Status: Normal mood and affect. Normal behavior. Normal judgment and thought content.   Urinalysis:      Assessment and Plan:  Pregnancy: G1P0 at [redacted]w[redacted]d  1. Supervision of high risk pregnancy, antepartum BP and FHR normal Swabs next visit Many good questions about FMLA, explained process and how to turn forms in at front desk  2. Chronic hypertension affecting pregnancy Normotensive on Nifed 60, taking ASA BPP done earlier today at [facility] Last growth [place] 03/14/23, EFW 64%, AC 89%, AFI 12.6 Discussed timing  of delivery, prefers on the earlier side which is reasonable IOL scheduled for [redacted]w[redacted]d on 04/18/2023 in AM Form faxed, orders placed  3. Rh negative state in antepartum period S/p rhogam 02/08/2023  4. Maternal morbid obesity, antepartum (HCC)   Preterm labor symptoms and general obstetric precautions including but not limited to vaginal bleeding, contractions, leaking of fluid and fetal movement were reviewed in detail with the patient. Please refer to After Visit Summary for other counseling recommendations.  Return in 2 weeks (on 04/04/2023) for [facility], ob visit, needs MD.   Venora Maples, MD

## 2023-03-21 NOTE — Progress Notes (Signed)
Patient states she has not felt the baby move today. She states the last time she felt fetal movement was last night around .

## 2023-03-21 NOTE — Patient Instructions (Signed)

## 2023-03-22 ENCOUNTER — Telehealth: Payer: Self-pay | Admitting: Family Medicine

## 2023-03-22 NOTE — Telephone Encounter (Signed)
Patient would like to know if we have signed off on their breast pump order, would like a call back

## 2023-03-22 NOTE — Telephone Encounter (Signed)
Called pt and informed pt that we have not received fax yet.  Confirmed that pt had correct fax number.  Encouraged pt to f/u with [place] at her appt on 04/04/23.    Leonette Nutting  03/22/23

## 2023-03-23 ENCOUNTER — Encounter: Payer: Medicaid Other | Admitting: Family Medicine

## 2023-03-28 ENCOUNTER — Encounter: Payer: Self-pay | Admitting: *Deleted

## 2023-03-28 ENCOUNTER — Ambulatory Visit: Payer: Medicaid Other | Attending: Obstetrics and Gynecology

## 2023-03-28 ENCOUNTER — Ambulatory Visit: Payer: Medicaid Other | Admitting: *Deleted

## 2023-03-28 VITALS — BP 124/85 | HR 105

## 2023-03-28 DIAGNOSIS — O0990 Supervision of high risk pregnancy, unspecified, unspecified trimester: Secondary | ICD-10-CM | POA: Diagnosis not present

## 2023-03-28 DIAGNOSIS — O99213 Obesity complicating pregnancy, third trimester: Secondary | ICD-10-CM | POA: Diagnosis not present

## 2023-03-28 DIAGNOSIS — D563 Thalassemia minor: Secondary | ICD-10-CM

## 2023-03-28 DIAGNOSIS — O285 Abnormal chromosomal and genetic finding on antenatal screening of mother: Secondary | ICD-10-CM

## 2023-03-28 DIAGNOSIS — O10913 Unspecified pre-existing hypertension complicating pregnancy, third trimester: Secondary | ICD-10-CM | POA: Diagnosis not present

## 2023-03-28 DIAGNOSIS — O10013 Pre-existing essential hypertension complicating pregnancy, third trimester: Secondary | ICD-10-CM | POA: Diagnosis not present

## 2023-03-28 DIAGNOSIS — Z3A35 35 weeks gestation of pregnancy: Secondary | ICD-10-CM | POA: Diagnosis not present

## 2023-03-28 DIAGNOSIS — O360130 Maternal care for anti-D [Rh] antibodies, third trimester, not applicable or unspecified: Secondary | ICD-10-CM

## 2023-04-03 ENCOUNTER — Encounter: Payer: Self-pay | Admitting: Cardiology

## 2023-04-03 ENCOUNTER — Ambulatory Visit (INDEPENDENT_AMBULATORY_CARE_PROVIDER_SITE_OTHER): Payer: Medicaid Other | Admitting: Cardiology

## 2023-04-03 VITALS — BP 128/84 | HR 122 | Ht 65.0 in | Wt 369.0 lb

## 2023-04-03 DIAGNOSIS — O10919 Unspecified pre-existing hypertension complicating pregnancy, unspecified trimester: Secondary | ICD-10-CM

## 2023-04-03 DIAGNOSIS — Z3A36 36 weeks gestation of pregnancy: Secondary | ICD-10-CM

## 2023-04-03 NOTE — Progress Notes (Signed)
Cardio-Obstetrics Clinic  New Evaluation  Date:  04/03/2023   ID:  Becky Porter, DOB Dec 07, 1990, MRN [id number]  PCP:  Venora Maples, MD   [facility] Providers Cardiologist:  Thomasene Ripple, DO  Electrophysiologist:  None       Referring MD: No ref. provider found   Chief Complaint: I am doing okay  History of Present Illness:    Becky Porter is a 32 y.o. female [G1P0] who is being seen today for the evaluation of chronic hypertension pregnancy at the request of No ref. provider found.   Medical history include chronic hypertension, morbid obesity here today to be evaluated for elevated blood pressure.  She is currently 35 weeks 6 days pregnant.  She offers no complaints today.  She is doing well from a cardiovascular standpoint.  Still plans for vaginal delivery.  Prior CV Studies Reviewed: The following studies were reviewed today: None available  Past Medical History:  Diagnosis Date   Hypertension     No past surgical history on file.    OB History     Gravida  1   Para      Term      Preterm      AB      Living         SAB      IAB      Ectopic      Multiple      Live Births                  Current Medications: Current Meds  Medication Sig   aspirin 81 MG chewable tablet Chew 1 tablet (81 mg total) by mouth daily. Start after 12 weeks   cyclobenzaprine (FLEXERIL) 10 MG tablet Take 1 tablet (10 mg total) by mouth every 8 (eight) hours as needed for muscle spasms.   NIFEdipine (PROCARDIA XL/NIFEDICAL XL) 60 MG 24 hr tablet Take 1 tablet (60 mg total) by mouth daily.   prenatal vitamin w/FE, FA (PRENATAL 1 + 1) 27-1 MG TABS tablet Take 1 tablet by mouth daily at 12 noon.     Allergies:   Patient has no known allergies.   Social History   Socioeconomic History   Marital status: Single    Spouse name: Not on file   Number of children: Not on file   Years of education: Not on file   Highest education level: Some  college, no degree  Occupational History   Not on file  Tobacco Use   Smoking status: Never   Smokeless tobacco: Not on file  Vaping Use   Vaping Use: Never used  Substance and Sexual Activity   Alcohol use: Not Currently    Comment: social   Drug use: No   Sexual activity: Yes    Birth control/protection: None  Other Topics Concern   Not on file  Social History Narrative   Not on file   Social Determinants of Health   Financial Resource Strain: Medium Risk (01/23/2023)   Overall Financial Resource Strain (CARDIA)    Difficulty of Paying Living Expenses: Somewhat hard  Food Insecurity: Food Insecurity Present (01/23/2023)   Hunger Vital Sign    Worried About Running Out of Food in the Last Year: Sometimes true    Ran Out of Food in the Last Year: Sometimes true  Transportation Needs: No Transportation Needs (01/23/2023)   PRAPARE - Administrator, Civil Service (Medical): No    Lack of Transportation (  Non-Medical): No  Recent Concern: Transportation Needs - Unmet Transportation Needs (01/23/2023)   PRAPARE - Transportation    Lack of Transportation (Medical): No    Lack of Transportation (Non-Medical): Yes  Physical Activity: Insufficiently Active (01/23/2023)   Exercise Vital Sign    Days of Exercise per Week: 3 days    Minutes of Exercise per Session: 10 min  Stress: Stress Concern Present (01/23/2023)   [employer] of Occupational Health - Occupational Stress Questionnaire    Feeling of Stress : Rather much  Social Connections: Moderately Isolated (01/23/2023)   Social Connection and Isolation Panel [NHANES]    Frequency of Communication with Friends and Family: More than three times a week    Frequency of Social Gatherings with Friends and Family: Twice a week    Attends Religious Services: 1 to 4 times per year    Active Member of [employer] or Organizations: No    Attends Engineer, structural: Not on file    Marital Status: Never married       Family History  Problem Relation Age of Onset   Kidney disease Father    Diabetes Maternal Grandmother    Stroke Maternal Grandmother    Stroke Paternal Grandmother    Cancer Paternal Grandfather    Asthma Neg Hx       ROS:   Please see the history of present illness.     All other systems reviewed and are negative.   Labs/EKG Reviewed:    EKG:   EKG is was not ordered today.    Recent Labs: 02/23/2023: ALT 23; BUN 6; Creatinine, Ser 0.56; Hemoglobin 11.5; Platelets 335; Potassium 3.8; Sodium 137   Recent Lipid Panel No results found for: "CHOL", "TRIG", "HDL", "CHOLHDL", "LDLCALC", "LDLDIRECT"  Physical Exam:    VS:  BP 128/84   Pulse (!) 122   Ht 5\' 5"  (1.651 m)   Wt (!) 369 lb (167.4 kg)   LMP 07/18/2022   SpO2 97%   BMI 61.40 kg/m     Wt Readings from Last 3 Encounters:  04/03/23 (!) 369 lb (167.4 kg)  03/21/23 (!) 369 lb (167.4 kg)  03/09/23 (!) 367 lb 11.2 oz (166.8 kg)     GEN:  Well nourished, well developed in no acute distress HEENT: Normal NECK: No JVD; No carotid bruits LYMPHATICS: No lymphadenopathy CARDIAC: RRR, no murmurs, rubs, gallops RESPIRATORY:  Clear to auscultation without rales, wheezing or rhonchi  ABDOMEN: Soft, non-tender, non-distended MUSCULOSKELETAL:  No edema; No deformity  SKIN: Warm and dry NEUROLOGIC:  Alert and oriented x 3 PSYCHIATRIC:  Normal affect    Risk Assessment/Risk Calculators:                  ASSESSMENT & PLAN:    Chronic hypertension in pregnancy Obesity pregnancy   Her blood pressure is at target in the office today.  Will continue her current therapy 60 mg daily. She is on aspirin 81 mg daily for preeclampsia prophylaxis we will continue that. Advised patient to keep weight gain between 11 to 20 pounds.  Will see her back in 6 weeks.   Patient Instructions  Medication Instructions:  Your physician recommends that you continue on your current medications as directed. Please refer to  the Current Medication list given to you today.  *If you need a refill on your cardiac medications before your next appointment, please call your pharmacy*   Lab Work: None   Testing/Procedures: None   Follow-Up: At [facility], you and your health needs are our priority.  As part of our continuing mission to provide you with exceptional heart care, we have created designated Provider Care Teams.  These Care Teams include your primary Cardiologist (physician) and Advanced Practice Providers (APPs -  Physician Assistants and Nurse Practitioners) who all work together to provide you with the care you need, when you need it.  Your next appointment:   6 week(s)  Provider:   Thomasene Ripple, DO    Dispo:  No follow-ups on file.   Medication Adjustments/Labs and Tests Ordered: Current medicines are reviewed at length with the patient today.  Concerns regarding medicines are outlined above.  Tests Ordered: No orders of the defined types were placed in this encounter.  Medication Changes: No orders of the defined types were placed in this encounter.

## 2023-04-03 NOTE — Patient Instructions (Signed)
Medication Instructions:  Your physician recommends that you continue on your current medications as directed. Please refer to the Current Medication list given to you today.  *If you need a refill on your cardiac medications before your next appointment, please call your pharmacy*   Lab Work: None   Testing/Procedures: None   Follow-Up: At  [facility], you and your health needs are our priority.  As part of our continuing mission to provide you with exceptional heart care, we have created designated Provider Care Teams.  These Care Teams include your primary Cardiologist (physician) and Advanced Practice Providers (APPs -  Physician Assistants and Nurse Practitioners) who all work together to provide you with the care you need, when you need it.    Your next appointment:   6 week(s)  Provider:   Kardie Tobb, DO    

## 2023-04-04 ENCOUNTER — Encounter (HOSPITAL_COMMUNITY): Payer: Self-pay | Admitting: *Deleted

## 2023-04-04 ENCOUNTER — Ambulatory Visit (INDEPENDENT_AMBULATORY_CARE_PROVIDER_SITE_OTHER): Payer: Medicaid Other | Admitting: Family Medicine

## 2023-04-04 ENCOUNTER — Ambulatory Visit: Payer: Medicaid Other | Attending: Cardiology

## 2023-04-04 ENCOUNTER — Encounter: Payer: Self-pay | Admitting: Family Medicine

## 2023-04-04 ENCOUNTER — Encounter: Payer: Self-pay | Admitting: *Deleted

## 2023-04-04 ENCOUNTER — Telehealth (HOSPITAL_COMMUNITY): Payer: Self-pay | Admitting: *Deleted

## 2023-04-04 ENCOUNTER — Ambulatory Visit: Payer: Medicaid Other | Admitting: *Deleted

## 2023-04-04 ENCOUNTER — Other Ambulatory Visit: Payer: Self-pay

## 2023-04-04 ENCOUNTER — Other Ambulatory Visit (HOSPITAL_COMMUNITY)
Admission: RE | Admit: 2023-04-04 | Discharge: 2023-04-04 | Disposition: A | Discharge status: Home / Self Care | Payer: Medicaid Other | Source: Ambulatory Visit | Attending: Family Medicine | Admitting: Family Medicine

## 2023-04-04 VITALS — BP 124/82 | HR 100

## 2023-04-04 VITALS — BP 123/85 | HR 102 | Wt 365.2 lb

## 2023-04-04 DIAGNOSIS — O99213 Obesity complicating pregnancy, third trimester: Secondary | ICD-10-CM | POA: Insufficient documentation

## 2023-04-04 DIAGNOSIS — O0990 Supervision of high risk pregnancy, unspecified, unspecified trimester: Secondary | ICD-10-CM | POA: Insufficient documentation

## 2023-04-04 DIAGNOSIS — O10913 Unspecified pre-existing hypertension complicating pregnancy, third trimester: Secondary | ICD-10-CM | POA: Diagnosis not present

## 2023-04-04 DIAGNOSIS — O26893 Other specified pregnancy related conditions, third trimester: Secondary | ICD-10-CM

## 2023-04-04 DIAGNOSIS — O10013 Pre-existing essential hypertension complicating pregnancy, third trimester: Secondary | ICD-10-CM | POA: Diagnosis not present

## 2023-04-04 DIAGNOSIS — D563 Thalassemia minor: Secondary | ICD-10-CM | POA: Diagnosis not present

## 2023-04-04 DIAGNOSIS — O10919 Unspecified pre-existing hypertension complicating pregnancy, unspecified trimester: Secondary | ICD-10-CM | POA: Diagnosis not present

## 2023-04-04 DIAGNOSIS — O99013 Anemia complicating pregnancy, third trimester: Secondary | ICD-10-CM | POA: Diagnosis not present

## 2023-04-04 DIAGNOSIS — Z6791 Unspecified blood type, Rh negative: Secondary | ICD-10-CM

## 2023-04-04 DIAGNOSIS — O360130 Maternal care for anti-D [Rh] antibodies, third trimester, not applicable or unspecified: Secondary | ICD-10-CM | POA: Diagnosis not present

## 2023-04-04 DIAGNOSIS — O0993 Supervision of high risk pregnancy, unspecified, third trimester: Secondary | ICD-10-CM

## 2023-04-04 DIAGNOSIS — Z1332 Encounter for screening for maternal depression: Secondary | ICD-10-CM

## 2023-04-04 DIAGNOSIS — Z3A36 36 weeks gestation of pregnancy: Secondary | ICD-10-CM

## 2023-04-04 NOTE — Patient Instructions (Signed)

## 2023-04-04 NOTE — Telephone Encounter (Signed)
Preadmission screen  

## 2023-04-04 NOTE — Progress Notes (Signed)
   Subjective:  Becky Porter is a 32 y.o. G1P0 at [redacted]w[redacted]d being seen today for ongoing prenatal care.  She is currently monitored for the following issues for this high-risk pregnancy and has Supervision of high risk pregnancy, antepartum; Chronic hypertension affecting pregnancy; Maternal morbid obesity, antepartum (HCC); Rh negative state in antepartum period; and Alpha thalassemia silent carrier on their problem list.  Patient reports no complaints.  Contractions: Irritability. Vag. Bleeding: None.  Movement: Present. Denies leaking of fluid.   The following portions of the patient's history were reviewed and updated as appropriate: allergies, current medications, past family history, past medical history, past social history, past surgical history and problem list. Problem list updated.  Objective:   Vitals:   04/04/23 1112  BP: 123/85  Pulse: (!) 102  Weight: (!) 365 lb 3.2 oz (165.7 kg)    Fetal Status: Fetal Heart Rate (bpm): 141   Movement: Present  Presentation: Undeterminable  General:  Alert, oriented and cooperative. Patient is in no acute distress.  Skin: Skin is warm and dry. No rash noted.   Cardiovascular: Normal heart rate noted  Respiratory: Normal respiratory effort, no problems with respiration noted  Abdomen: Soft, gravid, appropriate for gestational age. Pain/Pressure: Absent     Pelvic: Vag. Bleeding: None     Cervical exam performed Dilation: Closed      Extremities: Normal range of motion.  Edema: None  Mental Status: Normal mood and affect. Normal behavior. Normal judgment and thought content.   Urinalysis:      Assessment and Plan:  Pregnancy: G1P0 at [redacted]w[redacted]d  1. Supervision of high risk pregnancy, antepartum BP and FHR normal Swabs collected today Cervix feels closed, unable to determine presentation but cephalic last week on [place] - Culture, beta strep (group b only) - GC/Chlamydia probe amp ([place])not at [facility]  2. Chronic hypertension affecting  pregnancy Normotensive on Nifed 60, taking ASA Weekly BPP normal to date Last growth [place] 03/14/23, EFW 64%, AC 89%, AFI 12.6  IOL scheduled previously for 38 weeks  3. Rh negative state in antepartum period S/p rhogam 02/08/2023  4. Maternal morbid obesity, antepartum (HCC) Engaged in weekly antenatal testing   Preterm labor symptoms and general obstetric precautions including but not limited to vaginal bleeding, contractions, leaking of fluid and fetal movement were reviewed in detail with the patient. Please refer to After Visit Summary for other counseling recommendations.  Return in 1 week (on 04/11/2023) for Dyad patient, ob visit.   Venora Maples, MD

## 2023-04-05 ENCOUNTER — Encounter: Payer: Self-pay | Admitting: *Deleted

## 2023-04-05 LAB — GC/CHLAMYDIA PROBE AMP (~~LOC~~) NOT AT ARMC
Chlamydia: NEGATIVE
Comment: NEGATIVE
Comment: NORMAL
Neisseria Gonorrhea: NEGATIVE

## 2023-04-07 ENCOUNTER — Encounter: Payer: Self-pay | Admitting: Family Medicine

## 2023-04-07 DIAGNOSIS — O99820 Streptococcus B carrier state complicating pregnancy: Secondary | ICD-10-CM | POA: Insufficient documentation

## 2023-04-07 LAB — CULTURE, BETA STREP (GROUP B ONLY): Strep Gp B Culture: POSITIVE — AB

## 2023-04-09 ENCOUNTER — Other Ambulatory Visit: Payer: Self-pay

## 2023-04-09 ENCOUNTER — Inpatient Hospital Stay (HOSPITAL_COMMUNITY)
Admission: AD | Admit: 2023-04-09 | Discharge: 2023-04-09 | Disposition: A | Discharge status: Home / Self Care | Payer: Medicaid Other | Attending: Obstetrics & Gynecology | Admitting: Obstetrics & Gynecology

## 2023-04-09 ENCOUNTER — Encounter (HOSPITAL_COMMUNITY): Payer: Self-pay | Admitting: Obstetrics & Gynecology

## 2023-04-09 DIAGNOSIS — Z3A36 36 weeks gestation of pregnancy: Secondary | ICD-10-CM | POA: Diagnosis not present

## 2023-04-09 DIAGNOSIS — O368130 Decreased fetal movements, third trimester, not applicable or unspecified: Secondary | ICD-10-CM | POA: Diagnosis not present

## 2023-04-09 DIAGNOSIS — O479 False labor, unspecified: Secondary | ICD-10-CM | POA: Diagnosis not present

## 2023-04-09 DIAGNOSIS — O4703 False labor before 37 completed weeks of gestation, third trimester: Secondary | ICD-10-CM | POA: Insufficient documentation

## 2023-04-09 DIAGNOSIS — Z3493 Encounter for supervision of normal pregnancy, unspecified, third trimester: Secondary | ICD-10-CM

## 2023-04-09 NOTE — MAU Provider Note (Signed)
History     CSN: [id number]  Arrival date and time: 04/09/23 2007   Event Date/Time   First Provider Initiated Contact with Patient 04/09/23 2105      Chief Complaint  Patient presents with   Contractions   Decreased Fetal Movement   Becky Porter , a  32 y.o. G1P0 at [redacted]w[redacted]d presents to MAU with complaints of contractions that started about 3pm this afternoon. Patient reports since arrival to MAU they have decrease and she is not "feeling them as much." She states that she "couldn't sleep earlier because they hurt so bad." Currently rating them a 4/10, and states they are inconsistent. She denies vaginal bleeding, leaking of fluid. She reports fetal movement, just "not as much." She states that the baby did not follow her normal routine today like she normally does. She reports frequent and vigorous fetal movement since arrival to MAU.           OB History     Gravida  1   Para      Term      Preterm      AB      Living         SAB      IAB      Ectopic      Multiple      Live Births              Past Medical History:  Diagnosis Date   Hypertension    Pregnancy induced hypertension     History reviewed. No pertinent surgical history.  Family History  Problem Relation Age of Onset   Kidney disease Father    Diabetes Maternal Grandmother    Stroke Maternal Grandmother    Stroke Paternal Grandmother    Cancer Paternal Grandfather    Asthma Neg Hx     Social History   Tobacco Use   Smoking status: Never  Vaping Use   Vaping status: Never Used  Substance Use Topics   Alcohol use: Not Currently    Comment: social   Drug use: No    Allergies: No Known Allergies  Medications Prior to Admission  Medication Sig Dispense Refill Last Dose   aspirin 81 MG chewable tablet Chew 1 tablet (81 mg total) by mouth daily. Start after 12 weeks 60 tablet 3 04/08/2023   cyclobenzaprine (FLEXERIL) 10 MG tablet Take 1 tablet (10 mg total) by mouth every  8 (eight) hours as needed for muscle spasms. 30 tablet 1 Past Month   NIFEdipine (PROCARDIA XL/NIFEDICAL XL) 60 MG 24 hr tablet Take 1 tablet (60 mg total) by mouth daily. 90 tablet 3 04/09/2023   prenatal vitamin w/FE, FA (PRENATAL 1 + 1) 27-1 MG TABS tablet Take 1 tablet by mouth daily at 12 noon.   04/09/2023   iron polysaccharides (NIFEREX) 150 MG capsule Take 1 capsule (150 mg total) by mouth every other day. 15 capsule 2    Magnesium 400 MG CAPS Take 1 capsule by mouth daily. (Patient not taking: Reported on 03/21/2023) 30 capsule 5     Review of Systems  Constitutional:  Negative for chills, fatigue and fever.  Eyes:  Negative for pain and visual disturbance.  Respiratory:  Negative for apnea, shortness of breath and wheezing.   Cardiovascular:  Negative for chest pain and palpitations.  Gastrointestinal:  Positive for abdominal pain. Negative for constipation, diarrhea, nausea and vomiting.  Genitourinary:  Negative for difficulty urinating, dysuria, pelvic pain, vaginal bleeding, vaginal discharge  and vaginal pain.  Musculoskeletal:  Negative for back pain.  Neurological:  Negative for seizures, weakness and headaches.  Psychiatric/Behavioral:  Negative for suicidal ideas.    Physical Exam   Blood pressure 134/83, pulse (!) 138, temperature 98 F (36.7 C), temperature source Oral, resp. rate (!) 22, height 5\' 5"  (1.651 m), weight (!) 170.6 kg, last menstrual period 07/18/2022, SpO2 99%.  Physical Exam Vitals and nursing note reviewed.  Constitutional:      General: She is not in acute distress.    Appearance: Normal appearance.  HENT:     Head: Normocephalic.  Pulmonary:     Effort: Pulmonary effort is normal.  Musculoskeletal:     Cervical back: Normal range of motion.  Skin:    General: Skin is warm and dry.  Neurological:     Mental Status: She is alert and oriented to person, place, and time.  Psychiatric:        Mood and Affect: Mood normal.    Dilation:  Closed Exam by:: A Sherlon Handing RN  FHT: 135bpm with moderate variability. 15x15 accels present no decels noted.  - Toco Quiet   MAU Course  Procedures - Fetal kick count clicker provided.   MDM - Cervix closed. Low suspicion for labor  - Patient hit clicker >15 times in the first 30 mins of being on the monitor.  - plan for discharge.   Assessment and Plan   1. False labor   2. Movement of fetus present during pregnancy in third trimester   3. [redacted] weeks gestation of pregnancy    - reviewed signs and symptoms of labor.  - Discussed worsening signs and return precautions.  - Reviewed fetal kick count expectations in 3rd trimester.  - FHT appropriate for gestational age at time of discharge.  - Worsening signs and return precautions reviewed. - Patient discharged home in stable condition and may return to MAU as needed.   Claudette Head, MSN CNM  04/09/2023, 9:05 PM

## 2023-04-09 NOTE — MAU Note (Signed)
.  Becky Porter is a 32 y.o. at [redacted]w[redacted]d here in MAU reporting: ctx on and off since 1600 - starts in abdomen and wraps around to back. Denies VB or LOF. Baby is moving "a little bit less than normal". States has an induction scheduled next week for [facility].   Onset of complaint: 1600 Pain score: 8 Vitals:   04/09/23 2019  BP: 134/85  Pulse: (!) 122  Resp: (!) 22  Temp: 98 F (36.7 C)  SpO2: 99%     FHT:145 Lab orders placed from triage:  UA

## 2023-04-11 ENCOUNTER — Ambulatory Visit: Payer: Medicaid Other | Attending: Maternal & Fetal Medicine

## 2023-04-11 ENCOUNTER — Ambulatory Visit: Payer: Medicaid Other

## 2023-04-11 VITALS — BP 136/83 | HR 126

## 2023-04-11 DIAGNOSIS — O10919 Unspecified pre-existing hypertension complicating pregnancy, unspecified trimester: Secondary | ICD-10-CM | POA: Diagnosis not present

## 2023-04-11 DIAGNOSIS — Z3A37 37 weeks gestation of pregnancy: Secondary | ICD-10-CM

## 2023-04-11 DIAGNOSIS — O99213 Obesity complicating pregnancy, third trimester: Secondary | ICD-10-CM | POA: Insufficient documentation

## 2023-04-11 DIAGNOSIS — D563 Thalassemia minor: Secondary | ICD-10-CM | POA: Diagnosis not present

## 2023-04-11 DIAGNOSIS — O360130 Maternal care for anti-D [Rh] antibodies, third trimester, not applicable or unspecified: Secondary | ICD-10-CM

## 2023-04-11 DIAGNOSIS — O10013 Pre-existing essential hypertension complicating pregnancy, third trimester: Secondary | ICD-10-CM | POA: Diagnosis not present

## 2023-04-11 DIAGNOSIS — O0990 Supervision of high risk pregnancy, unspecified, unspecified trimester: Secondary | ICD-10-CM | POA: Diagnosis not present

## 2023-04-11 DIAGNOSIS — O99013 Anemia complicating pregnancy, third trimester: Secondary | ICD-10-CM

## 2023-04-13 ENCOUNTER — Encounter: Payer: Self-pay | Admitting: Family Medicine

## 2023-04-13 ENCOUNTER — Other Ambulatory Visit: Payer: Self-pay

## 2023-04-13 ENCOUNTER — Ambulatory Visit (INDEPENDENT_AMBULATORY_CARE_PROVIDER_SITE_OTHER): Payer: Medicaid Other | Admitting: Family Medicine

## 2023-04-13 VITALS — BP 131/89 | HR 95 | Wt 373.5 lb

## 2023-04-13 DIAGNOSIS — Z0289 Encounter for other administrative examinations: Secondary | ICD-10-CM

## 2023-04-13 DIAGNOSIS — O99820 Streptococcus B carrier state complicating pregnancy: Secondary | ICD-10-CM

## 2023-04-13 DIAGNOSIS — O26893 Other specified pregnancy related conditions, third trimester: Secondary | ICD-10-CM

## 2023-04-13 DIAGNOSIS — O10919 Unspecified pre-existing hypertension complicating pregnancy, unspecified trimester: Secondary | ICD-10-CM

## 2023-04-13 DIAGNOSIS — O10913 Unspecified pre-existing hypertension complicating pregnancy, third trimester: Secondary | ICD-10-CM

## 2023-04-13 DIAGNOSIS — Z3A37 37 weeks gestation of pregnancy: Secondary | ICD-10-CM

## 2023-04-13 DIAGNOSIS — O99213 Obesity complicating pregnancy, third trimester: Secondary | ICD-10-CM

## 2023-04-13 DIAGNOSIS — O0993 Supervision of high risk pregnancy, unspecified, third trimester: Secondary | ICD-10-CM

## 2023-04-13 DIAGNOSIS — Z6791 Unspecified blood type, Rh negative: Secondary | ICD-10-CM

## 2023-04-13 DIAGNOSIS — O0990 Supervision of high risk pregnancy, unspecified, unspecified trimester: Secondary | ICD-10-CM

## 2023-04-13 NOTE — Progress Notes (Signed)
   Subjective:  Becky Porter is a 32 y.o. G1P0 at [redacted]w[redacted]d being seen today for ongoing prenatal care.  She is currently monitored for the following issues for this high-risk pregnancy and has Supervision of high risk pregnancy, antepartum; Chronic hypertension affecting pregnancy; Maternal morbid obesity, antepartum (HCC); Rh negative state in antepartum period; Alpha thalassemia silent carrier; and GBS (group B Streptococcus carrier), +RV culture, currently pregnant on their problem list.  Patient reports no complaints.  Contractions: Irritability. Vag. Bleeding: None.  Movement: Present. Denies leaking of fluid.   The following portions of the patient's history were reviewed and updated as appropriate: allergies, current medications, past family history, past medical history, past social history, past surgical history and problem list. Problem list updated.  Objective:   Vitals:   04/13/23 1019  BP: 131/89  Pulse: 95  Weight: (!) 373 lb 8 oz (169.4 kg)    Fetal Status: Fetal Heart Rate (bpm): 141   Movement: Present     General:  Alert, oriented and cooperative. Patient is in no acute distress.  Skin: Skin is warm and dry. No rash noted.   Cardiovascular: Normal heart rate noted  Respiratory: Normal respiratory effort, no problems with respiration noted  Abdomen: Soft, gravid, appropriate for gestational age. Pain/Pressure: Present     Pelvic: Vag. Bleeding: None     Cervical exam deferred        Extremities: Normal range of motion.     Mental Status: Normal mood and affect. Normal behavior. Normal judgment and thought content.   Urinalysis:      Assessment and Plan:  Pregnancy: G1P0 at [redacted]w[redacted]d  1. Supervision of high risk pregnancy, antepartum BP and FHR normal Many great questions about labor, discussed typical process and labor induction methods  2. Rh negative state in antepartum period S/p rhogam 02/08/2023  3. GBS (group B Streptococcus carrier), +RV culture, currently  pregnant Ppx in labor  4. Chronic hypertension affecting pregnancy Normotensive on Nifed 60, taking ASA Weekly BPP normal to date Last growth [place] 04/11/23, EFW 23% 2741g, AC 34%  IOL scheduled for 38 weeks  5. Maternal morbid obesity, antepartum (HCC)   Term labor symptoms and general obstetric precautions including but not limited to vaginal bleeding, contractions, leaking of fluid and fetal movement were reviewed in detail with the patient. Please refer to After Visit Summary for other counseling recommendations.  Return in 6 weeks (on 05/25/2023) for Dyad patient, PP check.   Venora Maples, MD

## 2023-04-13 NOTE — Patient Instructions (Signed)

## 2023-04-14 ENCOUNTER — Inpatient Hospital Stay (HOSPITAL_COMMUNITY): Payer: Medicaid Other | Admitting: Anesthesiology

## 2023-04-14 ENCOUNTER — Encounter (HOSPITAL_COMMUNITY): Payer: Self-pay | Admitting: Family Medicine

## 2023-04-14 ENCOUNTER — Inpatient Hospital Stay (HOSPITAL_COMMUNITY)
Admission: AD | Admit: 2023-04-14 | Discharge: 2023-04-17 | DRG: 807 | Disposition: A | Discharge status: Home / Self Care | Payer: Medicaid Other | Attending: Obstetrics and Gynecology | Admitting: Obstetrics and Gynecology

## 2023-04-14 DIAGNOSIS — I10 Essential (primary) hypertension: Secondary | ICD-10-CM | POA: Diagnosis present

## 2023-04-14 DIAGNOSIS — O99824 Streptococcus B carrier state complicating childbirth: Secondary | ICD-10-CM | POA: Diagnosis not present

## 2023-04-14 DIAGNOSIS — Z6791 Unspecified blood type, Rh negative: Secondary | ICD-10-CM

## 2023-04-14 DIAGNOSIS — Z3A37 37 weeks gestation of pregnancy: Secondary | ICD-10-CM

## 2023-04-14 DIAGNOSIS — Z7982 Long term (current) use of aspirin: Secondary | ICD-10-CM

## 2023-04-14 DIAGNOSIS — O10919 Unspecified pre-existing hypertension complicating pregnancy, unspecified trimester: Secondary | ICD-10-CM | POA: Diagnosis not present

## 2023-04-14 DIAGNOSIS — Z148 Genetic carrier of other disease: Secondary | ICD-10-CM | POA: Diagnosis not present

## 2023-04-14 DIAGNOSIS — O26893 Other specified pregnancy related conditions, third trimester: Secondary | ICD-10-CM | POA: Diagnosis not present

## 2023-04-14 DIAGNOSIS — Z23 Encounter for immunization: Secondary | ICD-10-CM

## 2023-04-14 DIAGNOSIS — Z5986 Financial insecurity: Secondary | ICD-10-CM

## 2023-04-14 DIAGNOSIS — D563 Thalassemia minor: Secondary | ICD-10-CM | POA: Diagnosis present

## 2023-04-14 DIAGNOSIS — O99214 Obesity complicating childbirth: Secondary | ICD-10-CM | POA: Diagnosis present

## 2023-04-14 DIAGNOSIS — O26899 Other specified pregnancy related conditions, unspecified trimester: Secondary | ICD-10-CM

## 2023-04-14 DIAGNOSIS — O1092 Unspecified pre-existing hypertension complicating childbirth: Secondary | ICD-10-CM | POA: Diagnosis present

## 2023-04-14 DIAGNOSIS — O1404 Mild to moderate pre-eclampsia, complicating childbirth: Secondary | ICD-10-CM | POA: Diagnosis not present

## 2023-04-14 DIAGNOSIS — Z5982 Transportation insecurity: Secondary | ICD-10-CM

## 2023-04-14 DIAGNOSIS — O99820 Streptococcus B carrier state complicating pregnancy: Secondary | ICD-10-CM

## 2023-04-14 DIAGNOSIS — O114 Pre-existing hypertension with pre-eclampsia, complicating childbirth: Principal | ICD-10-CM | POA: Diagnosis present

## 2023-04-14 DIAGNOSIS — O0990 Supervision of high risk pregnancy, unspecified, unspecified trimester: Secondary | ICD-10-CM

## 2023-04-14 LAB — TYPE AND SCREEN
ABO/RH(D): A NEG
Antibody Screen: NEGATIVE

## 2023-04-14 LAB — CBC
HCT: 40.2 % (ref 36.0–46.0)
Hemoglobin: 12.6 g/dL (ref 12.0–15.0)
MCH: 24 pg — ABNORMAL LOW (ref 26.0–34.0)
MCHC: 31.3 g/dL (ref 30.0–36.0)
MCV: 76.6 fL — ABNORMAL LOW (ref 80.0–100.0)
Platelets: 350 10*3/uL (ref 150–400)
RBC: 5.25 MIL/uL — ABNORMAL HIGH (ref 3.87–5.11)
RDW: 15.1 % (ref 11.5–15.5)
WBC: 15 10*3/uL — ABNORMAL HIGH (ref 4.0–10.5)
nRBC: 0 % (ref 0.0–0.2)

## 2023-04-14 LAB — PROTEIN / CREATININE RATIO, URINE
Creatinine, Urine: 213 mg/dL
Protein Creatinine Ratio: 0.31 mg/mg{Cre} — ABNORMAL HIGH (ref 0.00–0.15)
Total Protein, Urine: 65 mg/dL

## 2023-04-14 LAB — COMPREHENSIVE METABOLIC PANEL
ALT: 27 U/L (ref 0–44)
AST: 26 U/L (ref 15–41)
Albumin: 2.6 g/dL — ABNORMAL LOW (ref 3.5–5.0)
Alkaline Phosphatase: 158 U/L — ABNORMAL HIGH (ref 38–126)
Anion gap: 9 (ref 5–15)
BUN: 12 mg/dL (ref 6–20)
CO2: 20 mmol/L — ABNORMAL LOW (ref 22–32)
Calcium: 9.2 mg/dL (ref 8.9–10.3)
Chloride: 105 mmol/L (ref 98–111)
Creatinine, Ser: 0.72 mg/dL (ref 0.44–1.00)
GFR, Estimated: >60 mL/min (ref >60)
Glucose, Bld: 109 mg/dL — ABNORMAL HIGH (ref 70–99)
Potassium: 3.7 mmol/L (ref 3.5–5.1)
Sodium: 134 mmol/L — ABNORMAL LOW (ref 135–145)
Total Bilirubin: 0.5 mg/dL (ref 0.3–1.2)
Total Protein: 7.6 g/dL (ref 6.5–8.1)

## 2023-04-14 MED ORDER — FENTANYL-BUPIVACAINE-NACL 0.5-0.125-0.9 MG/250ML-% EP SOLN
12.0000 mL/h | EPIDURAL | Status: DC | PRN
  Filled 2023-04-14: qty 250

## 2023-04-14 MED ORDER — ACETAMINOPHEN 325 MG PO TABS
650.0000 mg | ORAL_TABLET | ORAL | Status: DC | PRN
  Administered 2023-04-14: 650 mg via ORAL
  Filled 2023-04-14: qty 2

## 2023-04-14 MED ORDER — POLYSACCHARIDE IRON COMPLEX 150 MG PO CAPS
150.0000 mg | ORAL_CAPSULE | ORAL | Status: DC
  Administered 2023-04-15 – 2023-04-17 (×2): 150 mg via ORAL
  Filled 2023-04-14 (×3): qty 1

## 2023-04-14 MED ORDER — PHENYLEPHRINE 80 MCG/ML (10ML) SYRINGE FOR IV PUSH (FOR BLOOD PRESSURE SUPPORT): 80.0000 ug | PREFILLED_SYRINGE | INTRAVENOUS | Status: DC | PRN

## 2023-04-14 MED ORDER — LACTATED RINGERS IV SOLN: INTRAVENOUS | Status: DC

## 2023-04-14 MED ORDER — FENTANYL-BUPIVACAINE-NACL 0.5-0.125-0.9 MG/250ML-% EP SOLN
EPIDURAL | Status: DC | PRN
  Administered 2023-04-14: 12 mL/h via EPIDURAL

## 2023-04-14 MED ORDER — EPHEDRINE 5 MG/ML INJ: 10.0000 mg | INTRAVENOUS | Status: DC | PRN

## 2023-04-14 MED ORDER — LACTATED RINGERS IV SOLN: 500.0000 mL | INTRAVENOUS | Status: DC | PRN

## 2023-04-14 MED ORDER — OXYTOCIN-SODIUM CHLORIDE 30-0.9 UT/500ML-% IV SOLN
1.0000 m[IU]/min | INTRAVENOUS | Status: DC
  Administered 2023-04-14 – 2023-04-15 (×2): 2 m[IU]/min via INTRAVENOUS
  Filled 2023-04-14: qty 500

## 2023-04-14 MED ORDER — FENTANYL CITRATE (PF) 100 MCG/2ML IJ SOLN
50.0000 ug | INTRAMUSCULAR | Status: DC | PRN
  Administered 2023-04-14: 100 ug via INTRAVENOUS

## 2023-04-14 MED ORDER — SOD CITRATE-CITRIC ACID 500-334 MG/5ML PO SOLN: 30.0000 mL | ORAL | Status: DC | PRN

## 2023-04-14 MED ORDER — ONDANSETRON HCL 4 MG/2ML IJ SOLN
4.0000 mg | Freq: Four times a day (QID) | INTRAMUSCULAR | Status: DC | PRN
  Administered 2023-04-14: 4 mg via INTRAVENOUS

## 2023-04-14 MED ORDER — DIPHENHYDRAMINE HCL 50 MG/ML IJ SOLN: 12.5000 mg | INTRAMUSCULAR | Status: DC | PRN

## 2023-04-14 MED ORDER — ASPIRIN 81 MG PO CHEW
81.0000 mg | CHEWABLE_TABLET | Freq: Every day | ORAL | Status: DC
  Administered 2023-04-14: 81 mg via ORAL

## 2023-04-14 MED ORDER — OXYTOCIN BOLUS FROM INFUSION
333.0000 mL | Freq: Once | INTRAVENOUS | Status: AC
  Administered 2023-04-15: 333 mL via INTRAVENOUS

## 2023-04-14 MED ORDER — POLYSACCHARIDE IRON COMPLEX 150 MG PO CAPS
150.0000 mg | ORAL_CAPSULE | ORAL | Status: DC
  Filled 2023-04-14: qty 1

## 2023-04-14 MED ORDER — NIFEDIPINE ER OSMOTIC RELEASE 30 MG PO TB24
60.0000 mg | ORAL_TABLET | Freq: Every day | ORAL | Status: DC
  Administered 2023-04-14 – 2023-04-17 (×3): 60 mg via ORAL
  Filled 2023-04-14 (×2): qty 2

## 2023-04-14 MED ORDER — SODIUM CHLORIDE 0.9 % IV SOLN
1.0000 g | INTRAVENOUS | Status: DC
  Administered 2023-04-14 – 2023-04-15 (×6): 1 g via INTRAVENOUS
  Filled 2023-04-14 (×5): qty 1000

## 2023-04-14 MED ORDER — OXYTOCIN-SODIUM CHLORIDE 30-0.9 UT/500ML-% IV SOLN
2.5000 [IU]/h | INTRAVENOUS | Status: DC
  Administered 2023-04-15: 2.5 [IU]/h via INTRAVENOUS

## 2023-04-14 MED ORDER — LIDOCAINE HCL (PF) 1 % IJ SOLN: 30.0000 mL | INTRAMUSCULAR | Status: DC | PRN

## 2023-04-14 MED ORDER — TERBUTALINE SULFATE 1 MG/ML IJ SOLN
0.2500 mg | Freq: Once | INTRAMUSCULAR | Status: AC | PRN
  Administered 2023-04-15: 0.25 mg via SUBCUTANEOUS
  Filled 2023-04-14: qty 1

## 2023-04-14 MED ORDER — SODIUM CHLORIDE 0.9 % IV SOLN
2.0000 g | Freq: Once | INTRAVENOUS | Status: AC
  Administered 2023-04-14: 2 g via INTRAVENOUS

## 2023-04-14 MED ORDER — LIDOCAINE HCL (PF) 1 % IJ SOLN
INTRAMUSCULAR | Status: DC | PRN
  Administered 2023-04-14: 2 mL via EPIDURAL
  Administered 2023-04-14: 10 mL via EPIDURAL

## 2023-04-14 MED ORDER — LACTATED RINGERS IV SOLN
500.0000 mL | Freq: Once | INTRAVENOUS | Status: AC
  Administered 2023-04-14: 500 mL via INTRAVENOUS

## 2023-04-14 MED ORDER — PRENATAL PLUS 27-1 MG PO TABS
1.0000 | ORAL_TABLET | Freq: Every day | ORAL | Status: DC
  Administered 2023-04-14: 1 via ORAL
  Filled 2023-04-14 (×2): qty 1

## 2023-04-14 NOTE — Progress Notes (Signed)
Labor Progress Note  Becky Porter is a 32 y.o. G1P0 at [redacted]w[redacted]d presented for spontaneous labor  S: Patient is doing well, not feeling every contraction with the epidural in place. Denies feeling any increased pressure.   O:  BP 126/66   Pulse 95   Temp 97.9 F (36.6 C) (Oral)   Resp 18   LMP 07/18/2022   SpO2 100%  EFM:140 bpm/Moderate variability/ 15x15 accels/ None decels CAT: 1 Toco: Difficult to pick up all contractions, but appears to be contracting about every 5 minutes.   CVE: Dilation: 6 Effacement (%): 90 Cervical Position: Middle Station: -1 Presentation: Vertex Exam by:: Dr. Katrinka Blazing   A&P: 32 y.o. G1P0 [redacted]w[redacted]d  here for spontaneous labor.   #Labor: Patient has not made much cervical change since AROM. Patient is agreeable to start pitocin at this time.  #Pain: Epidural #FWB: CAT 1 #GBS positive, PCN ppx   Becky Arvin, MD [facility] PGY-2 04/14/23  6:35 PM

## 2023-04-14 NOTE — H&P (Addendum)
OBSTETRIC ADMISSION HISTORY AND PHYSICAL  Becky Porter is a 32 y.o. female G1P0 with IUP at [redacted]w[redacted]d by 1st trimester [place] presenting for spontaneous labor. Contractions started at 2am and became increasingly more intense and frequent around 7am. She reports +FMs, No LOF, no VB, no blurry vision, headaches or peripheral edema, and RUQ pain.  She plans on breast feeding. She request postplacental hormonal IUD for birth control. Denies any fevers, chills, lightheadedness, headache, vision changes, chest pain, or shortness of breath.   She received her prenatal care at [facility]   Dating: By 1st trimester US--->  Estimated Date of Delivery: 05/02/23  Sono:   @[redacted]w[redacted]d , CWD, normal anatomy, cephalic presentation, posterior placenta lie, 2741g, 23% EFW   Prenatal History/Complications: cHTN controlled on nifedipine, Obesity, alpha-thalassemia Gu-Win, Rh - status, GBS positive  Past Medical History: Past Medical History:  Diagnosis Date   Hypertension    Pregnancy induced hypertension     Past Surgical History: No past surgical history on file.  Obstetrical History: OB History     Gravida  1   Para      Term      Preterm      AB      Living         SAB      IAB      Ectopic      Multiple      Live Births              Social History Social History   Socioeconomic History   Marital status: Significant Other    Spouse name: Not on file   Number of children: Not on file   Years of education: Not on file   Highest education level: Some college, no degree  Occupational History   Not on file  Tobacco Use   Smoking status: Never   Smokeless tobacco: Not on file  Vaping Use   Vaping status: Never Used  Substance and Sexual Activity   Alcohol use: Not Currently    Comment: social   Drug use: No   Sexual activity: Not Currently    Birth control/protection: None  Other Topics Concern   Not on file  Social History Narrative   Not on file   Social Determinants of Health    Financial Resource Strain: Medium Risk (01/23/2023)   Overall Financial Resource Strain (CARDIA)    Difficulty of Paying Living Expenses: Somewhat hard  Food Insecurity: Food Insecurity Present (01/23/2023)   Hunger Vital Sign    Worried About Running Out of Food in the Last Year: Sometimes true    Ran Out of Food in the Last Year: Sometimes true  Transportation Needs: No Transportation Needs (01/23/2023)   PRAPARE - Administrator, Civil Service (Medical): No    Lack of Transportation (Non-Medical): No  Recent Concern: Transportation Needs - Unmet Transportation Needs (01/23/2023)   PRAPARE - Transportation    Lack of Transportation (Medical): No    Lack of Transportation (Non-Medical): Yes  Physical Activity: Insufficiently Active (01/23/2023)   Exercise Vital Sign    Days of Exercise per Week: 3 days    Minutes of Exercise per Session: 10 min  Stress: Stress Concern Present (01/23/2023)   [employer] of Occupational Health - Occupational Stress Questionnaire    Feeling of Stress : Rather much  Social Connections: Moderately Isolated (01/23/2023)   Social Connection and Isolation Panel [NHANES]    Frequency of Communication with Friends and Family: More than  three times a week    Frequency of Social Gatherings with Friends and Family: Twice a week    Attends Religious Services: 1 to 4 times per year    Active Member of [employer] or Organizations: No    Attends Engineer, structural: Not on file    Marital Status: Never married    Family History: Family History  Problem Relation Age of Onset   Kidney disease Father    Diabetes Maternal Grandmother    Stroke Maternal Grandmother    Stroke Paternal Grandmother    Cancer Paternal Grandfather    Asthma Neg Hx     Allergies: No Known Allergies  Medications Prior to Admission  Medication Sig Dispense Refill Last Dose   aspirin 81 MG chewable tablet Chew 1 tablet (81 mg total) by mouth daily. Start after 12  weeks 60 tablet 3    cyclobenzaprine (FLEXERIL) 10 MG tablet Take 1 tablet (10 mg total) by mouth every 8 (eight) hours as needed for muscle spasms. 30 tablet 1    iron polysaccharides (NIFEREX) 150 MG capsule Take 1 capsule (150 mg total) by mouth every other day. 15 capsule 2    Magnesium 400 MG CAPS Take 1 capsule by mouth daily. (Patient not taking: Reported on 03/21/2023) 30 capsule 5    NIFEdipine (PROCARDIA XL/NIFEDICAL XL) 60 MG 24 hr tablet Take 1 tablet (60 mg total) by mouth daily. 90 tablet 3    prenatal vitamin w/FE, FA (PRENATAL 1 + 1) 27-1 MG TABS tablet Take 1 tablet by mouth daily at 12 noon.        Review of Systems   All systems reviewed and negative except as stated in HPI  Blood pressure 138/86, pulse (!) 105, temperature 98 F (36.7 C), temperature source Oral, resp. rate 18, last menstrual period 07/18/2022, SpO2 99%. General appearance: cooperative and no distress, sleepy following fentanyl dosing Lungs: clear to auscultation bilaterally Heart: regular rate and rhythm Abdomen: soft, non-tender; bowel sounds normal Extremities: Homans sign is negative, no sign of DVT DTR's: Difficult to elicit due to body habitus  Presentation: cephalic Fetal monitoringBaseline: 145 bpm, Variability: Fair (1-6 bpm), Accelerations: non-reactive following fentanyl, and Decelerations: Absent Uterine activity: contracting every 3 minutes  Dilation: 6 Effacement (%): 90 Station: -2 Exam by:: Erle Crocker, RN   Prenatal labs: ABO, Rh: --/--/PENDING (07/19 1112) Antibody: PENDING (07/19 1112) Rubella: 1.00 (01/30 1126) RPR: Non Reactive (05/15 0849)  HBsAg: Negative (01/30 1126)  HIV: Non Reactive (05/15 0849)  GBS: Positive/-- (07/09 1137)  Early Hemoglobin A1c normal, 2 hr Glucola normal Genetic screening  NIPS low risk, AFP normal, Horizon silent carrier of alpha thalassemia.  Anatomy US normal  Prenatal Transfer Tool  Maternal Diabetes: No Genetic Screening:  Normal Maternal Ultrasounds/Referrals: Normal Fetal Ultrasounds or other Referrals:  None Maternal Substance Abuse:  No Significant Maternal Medications:  Meds include: Other: Nifedipine, ASA Significant Maternal Lab Results:  Group B Strep positive and Rh negative Number of Prenatal Visits:greater than 3 verified prenatal visits Other Comments:  None  Results for orders placed or performed during the hospital encounter of 04/14/23 (from the past 24 hour(s))  Type and screen   Collection Time: 04/14/23 11:12 AM  Result Value Ref Range   ABO/RH(D) PENDING    Antibody Screen PENDING    Sample Expiration      04/17/2023,2359 Performed at [facility], [address], [place], [place] [address]     Patient Active Problem List  Diagnosis Date Noted   GBS (group B Streptococcus carrier), +RV culture, currently pregnant 04/07/2023   Alpha thalassemia silent carrier 11/09/2022   Rh negative state in antepartum period 10/25/2022   Chronic hypertension affecting pregnancy 10/22/2022   Maternal morbid obesity, antepartum (HCC) 10/22/2022   Supervision of high risk pregnancy, antepartum 10/18/2022    Assessment/Plan:  Becky Porter is a 32 y.o. G1P0 at [redacted]w[redacted]d here for spontaneous labor, was scheduled for IOL for cHTN at 38 weeks.   #Labor: Progressing well, arrived at 6cm dilated. Will avoid augmentation if needed until antibiotics have been administered for GBS positive status #Pain: Fentanyl given, patient requesting epidural.  #FWB: Category II following fentanyl dosing #ID:  GBS positive, ampicillin #MOF: breast #MOC: postplacenta hormonal IUD, consent signed and IUD ordered   #cHTN: CBC, CMP, urine P/C to be obtained. Will monitor BP closely. Continue daily nifedipine. #Rh negative: Will need RhoGAM postpartum   Glee Arvin, MD  04/14/2023, 12:03 PM  Fellow Attestation  I saw and evaluated the patient, performing the key elements of the service.I  personally  performed or re-performed the history, physical exam, and medical decision making activities of this service and have verified that the service and findings are accurately documented in the resident's note. I developed the management plan that is described in the resident's note, and I agree with the content, with my edits above.   Alfredia Ferguson, MD,[facility] OB Fellow, Faculty Practice  04/14/2023 8:53 PM

## 2023-04-14 NOTE — Progress Notes (Signed)
Labor Progress Note  Becky Porter is a 32 y.o. G1P0 at [redacted]w[redacted]d presented for spontaneous labor  S: Patient is feeling more comfortable with epidural in place. Denies any fevers, chills, lightheadedness, headache, vision changes, chest pain, or shortness of breath. Discussed new diagnoses of SIPE.   O:  BP 127/79   Pulse 95   Temp 98.3 F (36.8 C) (Axillary)   Resp 18   LMP 07/18/2022   SpO2 100%  EFM:140 bpm/Moderate variability/ 15x15 accels/ None decels CAT: 1 Toco: contracting every 5 minutes   CVE: Dilation: 6 Effacement (%): 90 Cervical Position: Middle Station: -2 Presentation: Undeterminable Exam by:: Ardeen Jourdain, RN   A&P: 32 y.o. G1P0 [redacted]w[redacted]d  here for spontaneous labor  #Labor: AROM with moderate meconium at 1600 to help labor progression.  #Pain: Epidural #FWB: CAT 1 #GBS positive #Superimposed pre-eclampsia: CBC and CMP wnl. Urine P/C elevated, meeting criteria for SIPE. Patient counseled about diagnosis and had no further questions. Will continue nifedipine and monitor BP closely.   - Will start lasix postpartum   Glee Arvin, MD [facility] PGY-3 04/14/23  3:53 PM

## 2023-04-14 NOTE — Anesthesia Procedure Notes (Signed)
Epidural Patient location during procedure: OB Start time: 04/14/2023 12:44 PM End time: 04/14/2023 12:56 PM  Staffing Anesthesiologist: Lannie Fields, DO Performed: anesthesiologist   Preanesthetic Checklist Completed: patient identified, IV checked, risks and benefits discussed, monitors and equipment checked, pre-op evaluation and timeout performed  Epidural Patient position: sitting Prep: DuraPrep and site prepped and draped Patient monitoring: continuous pulse ox, blood pressure, heart rate and cardiac monitor Approach: midline Location: L3-L4 Injection technique: LOR air  Needle:  Needle type: Tuohy  Needle gauge: 17 G Needle length: 9 cm Needle insertion depth: 9 cm Catheter type: closed end flexible Catheter size: 19 Gauge Catheter at skin depth: 15 cm Test dose: negative  Assessment Sensory level: T8 Events: blood not aspirated, no cerebrospinal fluid, injection not painful, no injection resistance, no paresthesia and negative IV test  Additional Notes Patient identified. Risks/Benefits/Options discussed with patient including but not limited to bleeding, infection, nerve damage, paralysis, failed block, incomplete pain control, headache, blood pressure changes, nausea, vomiting, reactions to medication both or allergic, itching and postpartum back pain. Confirmed with bedside nurse the patient's most recent platelet count. Confirmed with patient that they are not currently taking any anticoagulation, have any bleeding history or any family history of bleeding disorders. Patient expressed understanding and wished to proceed. All questions were answered. Sterile technique was used throughout the entire procedure. Please see nursing notes for vital signs. Test dose was given through epidural catheter and negative prior to continuing to dose epidural or start infusion. Warning signs of high block given to the patient including shortness of breath, tingling/numbness in  hands, complete motor block, or any concerning symptoms with instructions to call for help. Patient was given instructions on fall risk and not to get out of bed. All questions and concerns addressed with instructions to call with any issues or inadequate analgesia.  Reason for block:procedure for pain

## 2023-04-14 NOTE — Anesthesia Preprocedure Evaluation (Signed)
Anesthesia Evaluation  Patient identified by MRN, date of birth, ID band Patient awake    Reviewed: Allergy & Precautions, Patient's Chart, lab work & pertinent test results  Airway Mallampati: IV  TM Distance: >3 FB Neck ROM: Full    Dental no notable dental hx.    Pulmonary neg pulmonary ROS   Pulmonary exam normal breath sounds clear to auscultation       Cardiovascular hypertension, Pt. on medications negative cardio ROS Normal cardiovascular exam Rhythm:Regular Rate:Normal     Neuro/Psych negative neurological ROS  negative psych ROS   GI/Hepatic negative GI ROS, Neg liver ROS,,,  Endo/Other    Morbid obesitySuper morbid obesity BMI 62  Renal/GU negative Renal ROS  negative genitourinary   Musculoskeletal negative musculoskeletal ROS (+)    Abdominal  (+) + obese  Peds negative pediatric ROS (+)  Hematology negative hematology ROS (+) Hb 12.6, plt 350   Anesthesia Other Findings   Reproductive/Obstetrics (+) Pregnancy                             Anesthesia Physical Anesthesia Plan  ASA: 4  Anesthesia Plan: Epidural   Post-op Pain Management:    Induction:   PONV Risk Score and Plan: 2  Airway Management Planned: Natural Airway  Additional Equipment: None  Intra-op Plan:   Post-operative Plan:   Informed Consent: I have reviewed the patients History and Physical, chart, labs and discussed the procedure including the risks, benefits and alternatives for the proposed anesthesia with the patient or authorized representative who has indicated his/her understanding and acceptance.       Plan Discussed with:   Anesthesia Plan Comments:        Anesthesia Quick Evaluation

## 2023-04-14 NOTE — Progress Notes (Signed)
Labor Progress Note  Becky Porter is a 32 y.o. G1P0 at [redacted]w[redacted]d presented for spontaneous labor  S: Patient is doing well. She asks about epidural usage.   O: Patient is comfortable and not in distress. BP 128/74   Pulse 96   Temp 98.2 F (36.8 C) (Oral)   Resp 18   LMP 07/18/2022   SpO2 100%  EFM: 145bpm/Moderate variability/ 15x15 accels/ subtle late vs early decel CAT: 2 Toco: irregular, every 2-4 minutes   CVE: Dilation: 6 Effacement (%): 90 Cervical Position: Middle Station: -1 Presentation: Vertex Exam by:: Lanae Crumbly   A&P: 31 y.o. G1P0 [redacted]w[redacted]d here for spontaneous labor as above  #Labor: Patient's CVE has not progressed since 1558. AROM 1600, Pit started 1843. Continue to increase as tolerated. IUPC placed at 2210 by Dr. Lanae Crumbly to better analyze pressure tracing. #Pain: Epidural #FWB: CAT  2 #GBS positive, Ampicillin received  Kayleen Memos, Medical Student [facility], Center for [facility] 04/14/23  9:11 PM

## 2023-04-15 ENCOUNTER — Encounter (HOSPITAL_COMMUNITY): Payer: Self-pay | Admitting: Family Medicine

## 2023-04-15 DIAGNOSIS — O1404 Mild to moderate pre-eclampsia, complicating childbirth: Secondary | ICD-10-CM | POA: Diagnosis not present

## 2023-04-15 DIAGNOSIS — O99820 Streptococcus B carrier state complicating pregnancy: Secondary | ICD-10-CM | POA: Diagnosis not present

## 2023-04-15 DIAGNOSIS — Z3A37 37 weeks gestation of pregnancy: Secondary | ICD-10-CM | POA: Diagnosis not present

## 2023-04-15 DIAGNOSIS — O99214 Obesity complicating childbirth: Secondary | ICD-10-CM | POA: Diagnosis not present

## 2023-04-15 DIAGNOSIS — O10919 Unspecified pre-existing hypertension complicating pregnancy, unspecified trimester: Secondary | ICD-10-CM | POA: Diagnosis not present

## 2023-04-15 LAB — CBC
HCT: 35.8 % — ABNORMAL LOW (ref 36.0–46.0)
Hemoglobin: 11.5 g/dL — ABNORMAL LOW (ref 12.0–15.0)
MCH: 24 pg — ABNORMAL LOW (ref 26.0–34.0)
MCHC: 32.1 g/dL (ref 30.0–36.0)
MCV: 74.6 fL — ABNORMAL LOW (ref 80.0–100.0)
Platelets: 282 10*3/uL (ref 150–400)
RBC: 4.8 MIL/uL (ref 3.87–5.11)
RDW: 15 % (ref 11.5–15.5)
WBC: 25.5 10*3/uL — ABNORMAL HIGH (ref 4.0–10.5)
nRBC: 0 % (ref 0.0–0.2)

## 2023-04-15 LAB — RPR: RPR Ser Ql: NONREACTIVE

## 2023-04-15 MED ORDER — ZOLPIDEM TARTRATE 5 MG PO TABS: 5.0000 mg | ORAL_TABLET | Freq: Every evening | ORAL | Status: DC | PRN

## 2023-04-15 MED ORDER — ONDANSETRON HCL 4 MG/2ML IJ SOLN: 4.0000 mg | INTRAMUSCULAR | Status: DC | PRN

## 2023-04-15 MED ORDER — COCONUT OIL OIL
1.0000 | TOPICAL_OIL | Status: DC | PRN
  Administered 2023-04-16: 1 via TOPICAL

## 2023-04-15 MED ORDER — ACETAMINOPHEN 325 MG PO TABS
650.0000 mg | ORAL_TABLET | ORAL | Status: DC | PRN
  Administered 2023-04-16: 650 mg via ORAL
  Filled 2023-04-15: qty 2

## 2023-04-15 MED ORDER — DIPHENHYDRAMINE HCL 25 MG PO CAPS: 25.0000 mg | ORAL_CAPSULE | Freq: Four times a day (QID) | ORAL | Status: DC | PRN

## 2023-04-15 MED ORDER — SODIUM CHLORIDE 0.9 % IV SOLN: 250.0000 mL | INTRAVENOUS | Status: DC | PRN

## 2023-04-15 MED ORDER — SIMETHICONE 80 MG PO CHEW: 80.0000 mg | CHEWABLE_TABLET | ORAL | Status: DC | PRN

## 2023-04-15 MED ORDER — DIBUCAINE (PERIANAL) 1 % EX OINT: 1.0000 | TOPICAL_OINTMENT | CUTANEOUS | Status: DC | PRN

## 2023-04-15 MED ORDER — FUROSEMIDE 20 MG PO TABS
20.0000 mg | ORAL_TABLET | Freq: Every day | ORAL | Status: DC
  Administered 2023-04-16 – 2023-04-17 (×2): 20 mg via ORAL
  Filled 2023-04-15 (×2): qty 1

## 2023-04-15 MED ORDER — PRENATAL MULTIVITAMIN CH
1.0000 | ORAL_TABLET | Freq: Every day | ORAL | Status: DC
  Administered 2023-04-16 – 2023-04-17 (×2): 1 via ORAL
  Filled 2023-04-15 (×2): qty 1

## 2023-04-15 MED ORDER — SODIUM CHLORIDE 0.9% FLUSH: 3.0000 mL | Freq: Two times a day (BID) | INTRAVENOUS | Status: DC

## 2023-04-15 MED ORDER — IBUPROFEN 600 MG PO TABS
600.0000 mg | ORAL_TABLET | Freq: Four times a day (QID) | ORAL | Status: DC
  Administered 2023-04-15 – 2023-04-17 (×8): 600 mg via ORAL
  Filled 2023-04-15 (×8): qty 1

## 2023-04-15 MED ORDER — ONDANSETRON HCL 4 MG PO TABS: 4.0000 mg | ORAL_TABLET | ORAL | Status: DC | PRN

## 2023-04-15 MED ORDER — BENZOCAINE-MENTHOL 20-0.5 % EX AERO
1.0000 | INHALATION_SPRAY | CUTANEOUS | Status: DC | PRN
  Administered 2023-04-15: 1 via TOPICAL
  Filled 2023-04-15: qty 56

## 2023-04-15 MED ORDER — SENNOSIDES-DOCUSATE SODIUM 8.6-50 MG PO TABS
2.0000 | ORAL_TABLET | ORAL | Status: DC
  Administered 2023-04-16 – 2023-04-17 (×2): 2 via ORAL
  Filled 2023-04-15 (×2): qty 2

## 2023-04-15 MED ORDER — SODIUM CHLORIDE 0.9% FLUSH: 3.0000 mL | INTRAVENOUS | Status: DC | PRN

## 2023-04-15 MED ORDER — WITCH HAZEL-GLYCERIN EX PADS: 1.0000 | MEDICATED_PAD | CUTANEOUS | Status: DC | PRN

## 2023-04-15 NOTE — Anesthesia Postprocedure Evaluation (Signed)
Anesthesia Post Note  Patient: Becky Porter  Procedure(s) Performed: AN AD HOC LABOR EPIDURAL     Patient location during evaluation: Mother Baby Anesthesia Type: Epidural Level of consciousness: awake and alert Pain management: pain level controlled Vital Signs Assessment: post-procedure vital signs reviewed and stable Respiratory status: spontaneous breathing, nonlabored ventilation and respiratory function stable Cardiovascular status: stable Postop Assessment: no headache, no backache and epidural receding Anesthetic complications: no   No notable events documented.  Last Vitals:  Vitals:   04/15/23 1742 04/15/23 1836  BP: 131/80 118/61  Pulse: 95 89  Resp:  18  Temp: 37.5 C 37.1 C  SpO2: 100%     Last Pain:  Vitals:   04/15/23 1836  TempSrc: Oral  PainSc: 0-No pain   Pain Goal:                   Tabias Swayze

## 2023-04-15 NOTE — Discharge Summary (Signed)
Postpartum Discharge Summary    Patient Name: Becky Porter DOB: 1991-07-30 MRN: [id number]  Date of admission: 04/14/2023 Delivery date:04/15/2023 Delivering provider: Celedonio Savage Date of discharge: 04/17/2023  Admitting diagnosis: Chronic hypertension affecting pregnancy [O10.919] Intrauterine pregnancy: [redacted]w[redacted]d     Secondary diagnosis:  Principal Problem:   Vaginal delivery Active Problems:   Supervision of high risk pregnancy, antepartum   Chronic hypertension affecting pregnancy   Rh negative state in antepartum period   Alpha thalassemia silent carrier   GBS (group B Streptococcus carrier), +RV culture, currently pregnant   Shoulder dystocia, delivered, current hospitalization  Additional problems: n/a    Discharge diagnosis: Term Pregnancy Delivered and CHTN with superimposed preeclampsia                                              Post partum procedures: n/a Augmentation: AROM, Pitocin, Cytotec, and IP Foley Complications: None  Hospital course: Induction of Labor With Vaginal Delivery   32 y.o. yo G1P1001 at [redacted]w[redacted]d was admitted to the hospital 04/14/2023 for induction of labor.  Indication for induction:  Chronic hypertension with superimposed preeclampsia .  Patient had an labor course complicated by shoulder dystocia resolved with modified wood screw. Membrane Rupture Time/Date: 4:00 PM,04/14/2023  Delivery Method:Vaginal, Spontaneous Episiotomy: None Lacerations:  None Details of delivery can be found in separate delivery note.  Patient had a postpartum course complicated by none. Patient is discharged home 04/17/23.  Newborn Data: Birth date:04/15/2023 Birth time:3:49 PM Gender:Female Living status:Living Apgars:5 ,8  Weight:2710 g  Magnesium Sulfate received: No BMZ received: No Rhophylac:yes MMR:N/A T-DaP:Given prenatally Flu: N/A Transfusion:No  Physical exam  Vitals:   04/16/23 1310 04/16/23 1900 04/16/23 2045 04/17/23 0528  BP: 125/82 (!)  150/65 (!) 146/86 130/85  Pulse: 87 88 93 100  Resp: 16 17 16 16   Temp: 98 F (36.7 C) 98 F (36.7 C)  98 F (36.7 C)  TempSrc:  Oral  Oral  SpO2:  100%  100%   General: alert, cooperative, and no distress Lochia: appropriate Uterine Fundus: firm Incision: N/A DVT Evaluation: No evidence of DVT seen on physical exam. Labs: Lab Results  Component Value Date   WBC 25.5 (H) 04/15/2023   HGB 11.5 (L) 04/15/2023   HCT 35.8 (L) 04/15/2023   MCV 74.6 (L) 04/15/2023   PLT 282 04/15/2023      Latest Ref Rng & Units 04/14/2023   12:00 PM  CMP  Glucose 70 - 99 mg/dL 782   BUN 6 - 20 mg/dL 12   Creatinine 9.56 - 1.00 mg/dL 2.13   Sodium 086 - 578 mmol/L 134   Potassium 3.5 - 5.1 mmol/L 3.7   Chloride 98 - 111 mmol/L 105   CO2 22 - 32 mmol/L 20   Calcium 8.9 - 10.3 mg/dL 9.2   Total Protein 6.5 - 8.1 g/dL 7.6   Total Bilirubin 0.3 - 1.2 mg/dL 0.5   Alkaline Phos 38 - 126 U/L 158   AST 15 - 41 U/L 26   ALT 0 - 44 U/L 27    Edinburgh Score:    04/16/2023   12:25 PM  Edinburgh Postnatal Depression Scale Screening Tool  I have been able to laugh and see the funny side of things. 0  I have looked forward with enjoyment to things. 0  I have blamed myself unnecessarily when things went  wrong. 0  I have been anxious or worried for no good reason. 0  I have felt scared or panicky for no good reason. 0  Things have been getting on top of me. 0  I have been so unhappy that I have had difficulty sleeping. 0  I have felt sad or miserable. 1  I have been so unhappy that I have been crying. 0  The thought of harming myself has occurred to me. 0  Edinburgh Postnatal Depression Scale Total 1     After visit meds:  Allergies as of 04/17/2023   No Known Allergies      Medication List     STOP taking these medications    aspirin 81 MG chewable tablet   cyclobenzaprine 10 MG tablet Commonly known as: FLEXERIL   iron polysaccharides 150 MG capsule Commonly known as: NIFEREX    Magnesium 400 MG Caps       TAKE these medications    acetaminophen 325 MG tablet Commonly known as: Tylenol Take 2 tablets (650 mg total) by mouth every 4 (four) hours as needed (for pain scale < 4).   benzocaine-Menthol 20-0.5 % Aero Commonly known as: DERMOPLAST Apply 1 Application topically as needed for irritation (perineal discomfort).   furosemide 20 MG tablet Commonly known as: LASIX Take 1 tablet (20 mg total) by mouth daily for 3 days.   ibuprofen 600 MG tablet Commonly known as: ADVIL Take 1 tablet (600 mg total) by mouth every 6 (six) hours.   NIFEdipine 60 MG 24 hr tablet Commonly known as: PROCARDIA XL/NIFEDICAL XL Take 1 tablet (60 mg total) by mouth daily. What changed: Another medication with the same name was added. Make sure you understand how and when to take each.   NIFEdipine 30 MG 24 hr tablet Commonly known as: ADALAT CC Take 1 tablet (30 mg total) by mouth at bedtime. What changed: You were already taking a medication with the same name, and this prescription was added. Make sure you understand how and when to take each.   prenatal vitamin w/FE, FA 27-1 MG Tabs tablet Take 1 tablet by mouth daily at 12 noon.   senna-docusate 8.6-50 MG tablet Commonly known as: Senokot-S Take 2 tablets by mouth daily.         Discharge home in stable condition Infant Feeding: Bottle and Breast Infant Disposition:home with mother Discharge instruction: per After Visit Summary and Postpartum booklet. Activity: Advance as tolerated. Pelvic rest for 6 weeks.  Diet: routine diet Future Appointments: Future Appointments  Date Time Provider Department Center  05/16/2023  9:00 AM Tobb, Lavona Mound, DO [facility] None   Follow up Visit:   Please schedule this patient for a In person postpartum visit in 4 weeks with the following provider: Any provider. Additional Postpartum F/U:BP check 1 week  High risk pregnancy complicated by: HTN Delivery mode:  Vaginal,  Spontaneous Anticipated Birth Control:  IUD at [facility] visit    04/17/2023 Myrtie Hawk, DO

## 2023-04-15 NOTE — Progress Notes (Signed)
Labor Progress Note  Becky Porter is a 32 y.o. G1P0 at [redacted]w[redacted]d presented for spontaneous labor  S: Notified of prolonged fetal late deceleration  O: Patient is comfortable and not in distress. BP 99/61 (BP Location: Right Arm)   Pulse 99   Temp 98.1 F (36.7 C) (Oral)   Resp 17   LMP 07/18/2022   SpO2 100%  EFM: 150 bpm/Moderate variability/ 15x15 accels/ Late decels CAT: 2 Toco: regular, every minute   CVE: Dilation: 10 Dilation Complete Date: 04/15/23 Dilation Complete Time: 0354 Effacement (%): 100 Cervical Position: Middle Station: 0 Presentation: Vertex Exam by:: Camelia Phenes, MD   A&P: 32 y.o. G1P0 [redacted]w[redacted]d here for spontaneous labor as above  #Labor: Progressing well.  Start Pitocin.  IV bolus given, give terbutaline x 1, turn patient on her side.  Plan to allow FHT to recover without added stressors.  Difficult SVE while patient was on her side.  It does seem that pt has progressed significantly.  Anticipate SVD. #Pain: Epidural #FWB: CAT 1-improved after interventions #GBS positive, Ampicillin ongoing # CHTN with superimposed preeclampsia without severe features: P/C 0.31.  BP well-controlled.  Continue nifedipine 60 daily.  CTM.  Myrtie Hawk, DO [place], Center for [facility] 04/15/23  4:04 AM

## 2023-04-15 NOTE — Progress Notes (Signed)
Labor Progress Note  Becky Porter is a 32 y.o. G1P0 at [redacted]w[redacted]d presented for spontaneous labor  S: Notified of fetal variable versus late decelerations  O: Patient is comfortable and not in distress. BP 110/60 (BP Location: Right Arm)   Pulse 99   Temp 98.1 F (36.7 C) (Oral)   Resp 17   LMP 07/18/2022   SpO2 100%  EFM: 150 bpm/Moderate variability/ 15x15 accels/ Variable to late decels CAT: 2 Toco: regular, every minute   CVE: Dilation: 7 Effacement (%): 90 Cervical Position: Middle Station: -2 Presentation: Vertex Exam by:: Lanae Crumbly   A&P: 32 y.o. G1P0 [redacted]w[redacted]d here for spontaneous labor as above  #Labor: Progressing well.  FSE placed.  Maternal position changed.  Decrease Pitocin from 18 to 14. #Pain: Epidural #FWB: CAT 2 #GBS positive, Ampicillin ongoing # CHTN with superimposed preeclampsia without severe features: P/C 0.31.  BP well-controlled.  Continue nifedipine 60 daily.  CTM.  Myrtie Hawk, DO [place], Center for [facility] 04/15/23  2:08 AM

## 2023-04-16 MED ORDER — NIFEDIPINE ER OSMOTIC RELEASE 30 MG PO TB24
30.0000 mg | ORAL_TABLET | Freq: Every day | ORAL | Status: DC
  Administered 2023-04-16: 30 mg via ORAL
  Filled 2023-04-16: qty 1

## 2023-04-16 MED ORDER — RHO D IMMUNE GLOBULIN 1500 UNIT/2ML IJ SOSY
300.0000 ug | PREFILLED_SYRINGE | Freq: Once | INTRAMUSCULAR | Status: AC
  Administered 2023-04-16: 300 ug via INTRAVENOUS
  Filled 2023-04-16: qty 2

## 2023-04-16 NOTE — Progress Notes (Signed)
POSTPARTUM PROGRESS NOTE  Post Partum Day 1  Subjective:  Becky Porter is a 32 y.o. G1P1001 s/p VD at [redacted]w[redacted]d.  She reports she is doing well. No acute events overnight. She denies any problems with ambulating, voiding or po intake. Denies nausea or vomiting.  Pain is well controlled.  Lochia is adequate.  Objective: Blood pressure 121/84, pulse 79, temperature 98.1 F (36.7 C), temperature source Oral, resp. rate 20, last menstrual period 07/18/2022, SpO2 100%, unknown if currently breastfeeding.  Physical Exam:  General: alert, cooperative and no distress Chest: no respiratory distress Heart:regular rate, distal pulses intact Uterine Fundus: firm, appropriately tender DVT Evaluation: No calf swelling or tenderness Extremities: No edema Skin: warm, dry  Recent Labs    04/14/23 1117 04/15/23 1704  HGB 12.6 11.5*  HCT 40.2 35.8*    Assessment/Plan: Becky Porter is a 32 y.o. G1P1001 s/p VD at [redacted]w[redacted]d   PPD# 1- Doing well  Routine postpartum care CHTN with superimposed preeclampsia without severe features: BP well-controlled.  Continue Lasix and nifedipine daily. Contraception: Outpatient IUD Feeding: Breast Dispo: Plan for discharge tomorrow.   LOS: 2 days   Myrtie Hawk, DO OB Fellow  04/16/2023, 7:35 AM

## 2023-04-16 NOTE — Lactation Note (Signed)
This note was copied from a baby's chart. Lactation Consultation Note  Patient Name: Becky Porter WGNFA'O Date: 04/16/2023 Age:32 hours Reason for consult: Breastfeeding assistance;Mother's request;1st time breastfeeding;Early term 37-38.6wks.  Per [employer] Parent she attended BF classes with WIC Program and has [facility] Breastfeeding International aid/development worker.   Birth Parent latched infant on her right breast using the football hold with assistance from [facility], infant briefly latched for 4 minutes then became sleepy. Infant was supplemented with 12 mls of 20 kcal formula, LC switched nipples from yellow to green and clear, infant did well with change in bottle nipple. Birth Parent knows to call RN/LC for further latch assistance if needed. Infant had 2 void diapers today. Birth Parent informed LC that she used DEBP earlier  and expressed 5 mls of colostrum that was given to infant at 11 am. LC praised Careers information officer and encouraged her to keep pumping to help stimulate her milk supply as she continues to work on Acupuncturist how to latch at the breast. LC reviewed maternal rest, diet and hydration. Birth Parent received her Ephriam Knuckles today.  Current feeding plan: 1- Birth Parent will continue to latch infant at breast for every feeding, * to 12+ times within 24 hours, skin to skin  and after 5 minutes if infant does not latch to stop and supplement infant with any EBM first and then formula. 2- Birth Parent has Breastfeeding Supplement Sheet and will follow Day 2 feeding guidelines due infant being almost 24 hours of life. 3- Birth Parent will continue to use DEBP every 3 hours for 15 minutes on initial setting and give infant back EBM first before formula.  1-   Maternal Data    Feeding Mother's Current Feeding Choice: Breast Milk and Formula Nipple Type: Slow - flow  LATCH Score Latch: Grasps breast easily, tongue down, lips flanged, rhythmical sucking.  Audible Swallowing: A few with  stimulation  Type of Nipple: Everted at rest and after stimulation  Comfort (Breast/Nipple): Soft / non-tender  Hold (Positioning): Assistance needed to correctly position infant at breast and maintain latch.  LATCH Score: 8   Lactation Tools Discussed/Used Breast pump type: Double-Electric Breast Pump Pump Education: Setup, frequency, and cleaning;Milk Storage Reason for Pumping: ETI female infant, infant poor feeder at breast in past, less than 6 lbs. Pumping frequency: Birth Parent will continue to use DEBP every 3 hours for 15 minutes on inital setting.  Interventions Interventions: Position options;Support pillows;Adjust position;Assisted with latch;Skin to skin;Breast compression;Education  Discharge Pump: Stork Pump (Birth Parent's Stork Pump was delivered to room today.)  Consult Status Consult Status: Follow-up Date: 04/17/23 Follow-up type: In-patient    Frederico Hamman 04/16/2023, 1:59 PM

## 2023-04-16 NOTE — Lactation Note (Signed)
This note was copied from a baby's chart. Lactation Consultation Note  Patient Name: Becky Porter GNFAO'Z Date: 04/16/2023 Age:32 hours Reason for consult: Initial assessment;1st time breastfeeding;Early term 37-38.6wks;Infant < 6lbs  P1, Baby [redacted]w[redacted]d. Offered to assist with latching and mother declined stating baby just fell asleep.  Mother has DEBP setup in her room.  Suggest she pump q 3 hours.  Mother stated she pumped once yesterday and did not get anything.  Provided education regarding the importance of stimulation.  Mother is aware she may need to supplement infant.  Suggest calling for latch or pumping assistance as needed.    Maternal Data Has patient been taught Hand Expression?: Yes Does the patient have breastfeeding experience prior to this delivery?: No  Feeding Mother's Current Feeding Choice: Breast Milk  LATCH Score Latch: Grasps breast easily, tongue down, lips flanged, rhythmical sucking.  Audible Swallowing: A few with stimulation  Type of Nipple: Everted at rest and after stimulation  Comfort (Breast/Nipple): Soft / non-tender  Hold (Positioning): Full assist, staff holds infant at breast  LATCH Score: 7   Lactation Tools Discussed/Used Tools: Pump Breast pump type: Double-Electric Breast Pump Pumping frequency:  (Recommend q 3 hours)  Interventions Interventions: Education  Discharge Pump: Stork Pump (paperwork sent)  Consult Status Consult Status: Follow-up Date: 04/16/23 Follow-up type: In-patient    [facility]ia [facility] 04/16/2023, 9:32 AM

## 2023-04-17 ENCOUNTER — Other Ambulatory Visit (HOSPITAL_COMMUNITY): Payer: Self-pay

## 2023-04-17 LAB — RH IG WORKUP (INCLUDES ABO/RH)
Fetal Screen: NEGATIVE
Gestational Age(Wks): 37
Unit division: 0

## 2023-04-17 MED ORDER — IBUPROFEN 600 MG PO TABS
600.0000 mg | ORAL_TABLET | Freq: Four times a day (QID) | ORAL | 0 refills | Status: DC
  Filled 2023-04-17: qty 30, 8d supply, fill #0

## 2023-04-17 MED ORDER — FUROSEMIDE 20 MG PO TABS
20.0000 mg | ORAL_TABLET | Freq: Every day | ORAL | 0 refills | Status: DC
  Filled 2023-04-17: qty 3, 3d supply, fill #0

## 2023-04-17 MED ORDER — SENNOSIDES-DOCUSATE SODIUM 8.6-50 MG PO TABS
2.0000 | ORAL_TABLET | ORAL | 0 refills | Status: DC
  Filled 2023-04-17: qty 30, 15d supply, fill #0

## 2023-04-17 MED ORDER — NIFEDIPINE ER 30 MG PO TB24
30.0000 mg | ORAL_TABLET | Freq: Every day | ORAL | 0 refills | Status: DC
  Filled 2023-04-17: qty 30, 30d supply, fill #0

## 2023-04-17 MED ORDER — ACETAMINOPHEN 325 MG PO TABS
650.0000 mg | ORAL_TABLET | ORAL | 0 refills | Status: DC | PRN
  Filled 2023-04-17: qty 100, 9d supply, fill #0

## 2023-04-17 MED ORDER — NIFEDIPINE ER 60 MG PO TB24
60.0000 mg | ORAL_TABLET | Freq: Every morning | ORAL | 0 refills | Status: DC
  Filled 2023-04-17: qty 30, 30d supply, fill #0

## 2023-04-17 MED ORDER — BENZOCAINE-MENTHOL 20-0.5 % EX AERO
1.0000 | INHALATION_SPRAY | CUTANEOUS | 0 refills | Status: DC | PRN
  Filled 2023-04-17: qty 78, fill #0

## 2023-04-18 ENCOUNTER — Inpatient Hospital Stay (HOSPITAL_COMMUNITY): Payer: Medicaid Other

## 2023-04-18 ENCOUNTER — Ambulatory Visit: Payer: Medicaid Other

## 2023-04-18 ENCOUNTER — Inpatient Hospital Stay (HOSPITAL_COMMUNITY): Admission: RE | Admit: 2023-04-18 | Payer: Medicaid Other | Source: Home / Self Care | Admitting: Family Medicine

## 2023-04-18 ENCOUNTER — Other Ambulatory Visit: Payer: Self-pay | Admitting: Family Medicine

## 2023-04-19 ENCOUNTER — Other Ambulatory Visit (HOSPITAL_COMMUNITY): Payer: Self-pay

## 2023-04-19 MED ORDER — FUROSEMIDE 20 MG PO TABS
20.0000 mg | ORAL_TABLET | Freq: Every day | ORAL | 0 refills | Status: DC
  Filled 2023-04-19 – 2023-04-20 (×2): qty 3, 3d supply, fill #0

## 2023-04-20 ENCOUNTER — Other Ambulatory Visit (HOSPITAL_COMMUNITY): Payer: Self-pay

## 2023-04-21 ENCOUNTER — Encounter: Payer: Medicaid Other | Admitting: Family Medicine

## 2023-04-24 ENCOUNTER — Ambulatory Visit (INDEPENDENT_AMBULATORY_CARE_PROVIDER_SITE_OTHER): Payer: Medicaid Other | Admitting: *Deleted

## 2023-04-24 ENCOUNTER — Other Ambulatory Visit: Payer: Self-pay

## 2023-04-24 VITALS — BP 131/79 | HR 85 | Ht 65.0 in | Wt 367.5 lb

## 2023-04-24 DIAGNOSIS — Z0130 Encounter for examination of blood pressure without abnormal findings: Secondary | ICD-10-CM

## 2023-04-24 NOTE — Progress Notes (Signed)
Here today for bp check s/p IOL for CHTN/ superimposed pre-eclampsia she had vaginal delivery 04/15/23 and d/c on 722/24 with Procardia 60mg  daily. She did not take it today. She had headache once but relieved with food, rest, and tylenol. She stated edema has gotten better, now trace. BP wnl, reviewed symptoms of pre-eclampsia.  Reviewed postpartum appointment. She voices understanding. Nancy Fetter

## 2023-04-25 NOTE — Progress Notes (Signed)
Attestation of Attending Supervision of clinical support staff: I agree with the care provided to this patient and was available for any consultation.  I have reviewed the RN's note and chart. I was available for consult and to see the patient if needed.   Kimberly Newton MD MPH Attending Physician Faculty Practice- [facility]  

## 2023-05-16 ENCOUNTER — Encounter: Payer: Self-pay | Admitting: Family Medicine

## 2023-05-16 ENCOUNTER — Ambulatory Visit (INDEPENDENT_AMBULATORY_CARE_PROVIDER_SITE_OTHER): Payer: Medicaid Other | Admitting: Family Medicine

## 2023-05-16 ENCOUNTER — Encounter: Payer: Self-pay | Admitting: Cardiology

## 2023-05-16 ENCOUNTER — Ambulatory Visit: Payer: Medicaid Other | Attending: Cardiology | Admitting: Cardiology

## 2023-05-16 VITALS — BP 132/98 | HR 94 | Ht 65.0 in | Wt 362.0 lb

## 2023-05-16 DIAGNOSIS — Z30430 Encounter for insertion of intrauterine contraceptive device: Secondary | ICD-10-CM

## 2023-05-16 DIAGNOSIS — Z79899 Other long term (current) drug therapy: Secondary | ICD-10-CM

## 2023-05-16 DIAGNOSIS — I10 Essential (primary) hypertension: Secondary | ICD-10-CM

## 2023-05-16 MED ORDER — LEVONORGESTREL 20.1 MCG/DAY IU IUD
1.0000 | INTRAUTERINE_SYSTEM | Freq: Once | INTRAUTERINE | Status: AC
Start: 2023-05-16 — End: 2023-05-16
  Administered 2023-05-16: 1 via INTRAUTERINE

## 2023-05-16 MED ORDER — NIFEDIPINE ER OSMOTIC RELEASE 60 MG PO TB24: 60.0000 mg | ORAL_TABLET | Freq: Every evening | ORAL | 3 refills | Status: DC

## 2023-05-16 MED ORDER — NIFEDIPINE ER OSMOTIC RELEASE 90 MG PO TB24: 90.0000 mg | ORAL_TABLET | Freq: Every morning | ORAL | 3 refills | Status: DC

## 2023-05-16 NOTE — Progress Notes (Signed)
Post Partum Visit Note  Becky Porter is a 32 y.o. G28P1001 female who presents for a postpartum visit. She is 4 weeks postpartum following a normal spontaneous vaginal delivery.  I have fully reviewed the prenatal and intrapartum course. The delivery was at 37.4 gestational weeks.  Anesthesia: epidural. Postpartum course has been normal. Baby is doing well. Baby is feeding by both breast and bottle - Similac Advance. Bleeding no bleeding. Bowel function is normal. Bladder function is normal. Patient is sexually active. Contraception method is IUD. Postpartum depression screening: negative.   The pregnancy intention screening data noted above was reviewed. Potential methods of contraception were discussed. The patient elected to proceed with IUD   Edinburgh Postnatal Depression Scale - 05/16/23 1206       Edinburgh Postnatal Depression Scale:  In the Past 7 Days   I have been able to laugh and see the funny side of things. 0    I have looked forward with enjoyment to things. 0    I have blamed myself unnecessarily when things went wrong. 0    I have been anxious or worried for no good reason. 0    I have felt scared or panicky for no good reason. 0    Things have been getting on top of me. 0    I have been so unhappy that I have had difficulty sleeping. 0    I have felt sad or miserable. 0    I have been so unhappy that I have been crying. 0    The thought of harming myself has occurred to me. 0    Edinburgh Postnatal Depression Scale Total 0             Health Maintenance Due  Topic Date Due   COVID-19 Vaccine (1) Never done   INFLUENZA VACCINE  04/27/2023    The following portions of the patient's history were reviewed and updated as appropriate: allergies, current medications, past family history, past medical history, past social history, past surgical history, and problem list.  Review of Systems Pertinent items noted in HPI and remainder of comprehensive ROS  otherwise negative.  Objective:  BP 122/84   Pulse 91   Ht 5\' 5"  (1.651 m)   Wt (!) 360 lb 12.8 oz (163.7 kg)   LMP  (LMP Unknown)   Breastfeeding Yes   BMI 60.04 kg/m    General:  alert, cooperative, and appears stated age   Breasts:  not indicated  Lungs: Comfortalbe on room air  GU exam:  normal   Procedure: Patient identified, informed consent performed, signed copy in chart, time out was performed.  Urine pregnancy test negative.  Speculum placed in the vagina.  Cervix visualized.  Cleaned with Betadine x 2.  Grasped anteriourly with a single tooth tenaculum.  Uterus sounded to 12 cm.  Liletta IUD placed per manufacturer's recommendations.  Strings trimmed to 3 cm.   Patient given post procedure instructions .       Assessment:   Normal postpartum exam.   Plan:   Essential components of care per ACOG recommendations:  1.  Mood and well being: Patient with negative depression screening today. Reviewed local resources for support.  - Patient tobacco use? No.   - hx of drug use? No.    2. Infant care and feeding:  -Patient currently breastmilk feeding? Yes. Discussed returning to work and pumping.  -Social determinants of health (SDOH) reviewed in EPIC. No concerns  3. Sexuality, contraception  and birth spacing - Patient does not want a pregnancy in the next year.  Desired family size is 3 children.  - Reviewed reproductive life planning. Reviewed contraceptive methods based on pt preferences and effectiveness.  Patient desired IUD.   - Discussed birth spacing of 18 months  4. Sleep and fatigue -Encouraged family/partner/community support of 4 hrs of uninterrupted sleep to help with mood and fatigue  5. Physical Recovery  - Discussed patients delivery and complications. She describes her labor as good. - Patient had a Vaginal problems after delivery including shoulder dystocia . Patient had no laceration. Perineal healing reviewed. Patient expressed  understanding - Patient has urinary incontinence? No. - Patient is safe to resume physical and sexual activity  6.  Health Maintenance - HM due items addressed Yes - Last pap smear  Diagnosis  Date Value Ref Range Status  10/25/2022   Final   - Negative for intraepithelial lesion or malignancy (NILM)   Pap smear not done at today's visit.  -Breast Cancer screening indicated? No.   7. Chronic Disease/Pregnancy Condition follow up: Hypertension on meds with normal BP on Procardia   Reva Bores 4:50 PM 05/16/2023

## 2023-05-16 NOTE — Patient Instructions (Addendum)
Medication Instructions:  Your physician has recommended you make the following change in your medication:  INCREASE: Nifedipine 90 mg in the morning 60 mg at night  Please take your blood pressure daily for 1 weeks and send in a MyChart message. Please include heart rates.   HOW TO TAKE YOUR BLOOD PRESSURE: Rest 5 minutes before taking your blood pressure. Don't smoke or drink caffeinated beverages for at least 30 minutes before. Take your blood pressure before (not after) you eat. Sit comfortably with your back supported and both feet on the floor (don't cross your legs). Elevate your arm to heart level on a table or a desk. Use the proper sized cuff. It should fit smoothly and snugly around your bare upper arm. There should be enough room to slip a fingertip under the cuff. The bottom edge of the cuff should be 1 inch above the crease of the elbow. Ideally, take 3 measurements at one sitting and record the average.  *If you need a refill on your cardiac medications before your next appointment, please call your pharmacy*   Lab Work: Your physician recommends that you have labs drawn today: BMET, LFTs If you have labs (blood work) drawn today and your tests are completely normal, you will receive your results only by: MyChart Message (if you have MyChart) OR A paper copy in the mail If you have any lab test that is abnormal or we need to change your treatment, we will call you to review the results.   Testing/Procedures: None   Follow-Up: At [facility], you and your health needs are our priority.  As part of our continuing mission to provide you with exceptional heart care, we have created designated Provider Care Teams.  These Care Teams include your primary Cardiologist (physician) and Advanced Practice Providers (APPs -  Physician Assistants and Nurse Practitioners) who all work together to provide you with the care you need, when you need it.  Your next appointment:    12 week(s)  Provider:   Thomasene Ripple, DO     Other Instructions Please see our pharmacy staff [place] in 2-3 weeks.

## 2023-05-16 NOTE — Progress Notes (Unsigned)
Cardio-Obstetrics Clinic  New Evaluation  Date:  05/16/2023   ID:  Becky Porter, DOB October 13, 1990, MRN [id number]  PCP:  Pcp, No   [name] Providers Cardiologist:  Thomasene Ripple, DO  Electrophysiologist:  None       Referring MD: Venora Maples, MD   Chief Complaint: I am doing okay  History of Present Illness:    Becky Porter is a 32 y.o. female [G1P1001] who is being seen today for the evaluation of chronic hypertension pregnancy at the request of Venora Maples, MD.   Medical history include chronic hypertension, morbid obesity here today to be evaluated for elevated blood pressure.  This is her postpartum visit.  She is here with the baby.  She reports that she also is breast-feeding.  Prior CV Studies Reviewed: The following studies were reviewed today: None available  Past Medical History:  Diagnosis Date   Hypertension    Pregnancy induced hypertension     No past surgical history on file.    OB History     Gravida  1   Para  1   Term  1   Preterm      AB      Living  1      SAB      IAB      Ectopic      Multiple  0   Live Births  1               Current Medications: Current Meds  Medication Sig   acetaminophen (TYLENOL) 325 MG tablet Take 2 tablets (650 mg total) by mouth every 4 (four) hours as needed (for pain scale < 4).   benzocaine-Menthol (DERMOPLAST) 20-0.5 % AERO Apply 1 Application topically as needed for irritation (perineal discomfort).   Ferrous Sulfate (IRON PO) Take 1 tablet by mouth daily at 6 (six) AM.   ibuprofen (ADVIL) 600 MG tablet Take 1 tablet (600 mg total) by mouth every 6 (six) hours.   NIFEdipine (ADALAT CC) 30 MG 24 hr tablet Take 1 tablet (30 mg total) by mouth at bedtime.   NIFEdipine (ADALAT CC) 60 MG 24 hr tablet Take 1 tablet (60 mg total) by mouth in the morning.   NIFEdipine (PROCARDIA XL/NIFEDICAL XL) 60 MG 24 hr tablet Take 1 tablet (60 mg total) by mouth daily.    prenatal vitamin w/FE, FA (PRENATAL 1 + 1) 27-1 MG TABS tablet Take 1 tablet by mouth daily at 12 noon.   senna-docusate (SENOKOT-S) 8.6-50 MG tablet Take 2 tablets by mouth daily.     Allergies:   Patient has no known allergies.   Social History   Socioeconomic History   Marital status: Significant Other    Spouse name: Not on file   Number of children: Not on file   Years of education: Not on file   Highest education level: Some college, no degree  Occupational History   Not on file  Tobacco Use   Smoking status: Never   Smokeless tobacco: Not on file  Vaping Use   Vaping status: Never Used  Substance and Sexual Activity   Alcohol use: Not Currently    Comment: social   Drug use: No   Sexual activity: Not Currently    Birth control/protection: None  Other Topics Concern   Not on file  Social History Narrative   Not on file   Social Determinants of Health   Financial Resource Strain: Medium Risk (01/23/2023)   Overall  Financial Resource Strain (CARDIA)    Difficulty of Paying Living Expenses: Somewhat hard  Food Insecurity: Food Insecurity Present (01/23/2023)   Hunger Vital Sign    Worried About Running Out of Food in the Last Year: Sometimes true    Ran Out of Food in the Last Year: Sometimes true  Transportation Needs: No Transportation Needs (01/23/2023)   PRAPARE - Administrator, Civil Service (Medical): No    Lack of Transportation (Non-Medical): No  Recent Concern: Transportation Needs - Unmet Transportation Needs (01/23/2023)   PRAPARE - Transportation    Lack of Transportation (Medical): No    Lack of Transportation (Non-Medical): Yes  Physical Activity: Insufficiently Active (01/23/2023)   Exercise Vital Sign    Days of Exercise per Week: 3 days    Minutes of Exercise per Session: 10 min  Stress: Stress Concern Present (01/23/2023)   [employer] of Occupational Health - Occupational Stress Questionnaire    Feeling of Stress : Rather much   Social Connections: Moderately Isolated (01/23/2023)   Social Connection and Isolation Panel [NHANES]    Frequency of Communication with Friends and Family: More than three times a week    Frequency of Social Gatherings with Friends and Family: Twice a week    Attends Religious Services: 1 to 4 times per year    Active Member of [employer] or Organizations: No    Attends Engineer, structural: Not on file    Marital Status: Never married      Family History  Problem Relation Age of Onset   Kidney disease Father    Diabetes Maternal Grandmother    Stroke Maternal Grandmother    Stroke Paternal Grandmother    Cancer Paternal Grandfather    Asthma Neg Hx       ROS:   Please see the history of present illness.     All other systems reviewed and are negative.   Labs/EKG Reviewed:    EKG:   EKG is was not ordered today.    Recent Labs: 04/14/2023: ALT 27; BUN 12; Creatinine, Ser 0.72; Potassium 3.7; Sodium 134 04/15/2023: Hemoglobin 11.5; Platelets 282   Recent Lipid Panel No results found for: "CHOL", "TRIG", "HDL", "CHOLHDL", "LDLCALC", "LDLDIRECT"  Physical Exam:    VS:  BP (!) 132/98 (BP Location: Left Arm, Patient Position: Sitting, Cuff Size: Large)   Pulse 94   Ht 5\' 5"  (1.651 m)   Wt (!) 362 lb (164.2 kg)   LMP 07/18/2022   SpO2 98%   BMI 60.24 kg/m     Wt Readings from Last 3 Encounters:  05/16/23 (!) 362 lb (164.2 kg)  04/24/23 (!) 367 lb 8 oz (166.7 kg)  04/13/23 (!) 373 lb 8 oz (169.4 kg)     GEN:  Well nourished, well developed in no acute distress HEENT: Normal NECK: No JVD; No carotid bruits LYMPHATICS: No lymphadenopathy CARDIAC: RRR, no murmurs, rubs, gallops RESPIRATORY:  Clear to auscultation without rales, wheezing or rhonchi  ABDOMEN: Soft, non-tender, non-distended MUSCULOSKELETAL:  No edema; No deformity  SKIN: Warm and dry NEUROLOGIC:  Alert and oriented x 3 PSYCHIATRIC:  Normal affect    Risk Assessment/Risk Calculators:             { Click for CHADS2VASc Score - THEN Refresh Note    :[id number]}      ASSESSMENT & PLAN:    Chronic hypertension   She is hypertensive in the office today.  Like to increase her nifedipine to 90  mg in the morning and 60 mg at night.  The patient understands the need to lose weight with diet and exercise. We have discussed specific strategies for this.    There are no Patient Instructions on file for this visit.   Dispo:  No follow-ups on file.   Medication Adjustments/Labs and Tests Ordered: Current medicines are reviewed at length with the patient today.  Concerns regarding medicines are outlined above.  Tests Ordered: No orders of the defined types were placed in this encounter.  Medication Changes: No orders of the defined types were placed in this encounter.

## 2023-05-17 LAB — BASIC METABOLIC PANEL
BUN/Creatinine Ratio: 18 (ref 9–23)
BUN: 14 mg/dL (ref 6–20)
CO2: 22 mmol/L (ref 20–29)
Calcium: 9.3 mg/dL (ref 8.7–10.2)
Chloride: 104 mmol/L (ref 96–106)
Creatinine, Ser: 0.8 mg/dL (ref 0.57–1.00)
Glucose: 91 mg/dL (ref 70–99)
Potassium: 4.2 mmol/L (ref 3.5–5.2)
Sodium: 139 mmol/L (ref 134–144)
eGFR: 100 mL/min/{1.73_m2} (ref >59)

## 2023-05-17 LAB — MAGNESIUM: Magnesium: 1.8 mg/dL (ref 1.6–2.3)

## 2023-05-17 LAB — HEPATIC FUNCTION PANEL
ALT: 11 IU/L (ref 0–32)
AST: 10 IU/L (ref 0–40)
Albumin: 3.7 g/dL — ABNORMAL LOW (ref 3.9–4.9)
Alkaline Phosphatase: 99 IU/L (ref 44–121)
Bilirubin Total: 0.4 mg/dL (ref 0.0–1.2)
Bilirubin, Direct: 0.12 mg/dL (ref 0.00–0.40)
Total Protein: 7 g/dL (ref 6.0–8.5)

## 2023-05-21 ENCOUNTER — Encounter: Payer: Self-pay | Admitting: Cardiology

## 2023-06-01 ENCOUNTER — Encounter: Payer: Self-pay | Admitting: Pharmacist Clinician (PhC)/ Clinical Pharmacy Specialist

## 2023-06-01 ENCOUNTER — Ambulatory Visit
Payer: Medicaid Other | Attending: Internal Medicine | Admitting: Pharmacist Clinician (PhC)/ Clinical Pharmacy Specialist

## 2023-06-01 VITALS — BP 115/76 | HR 80

## 2023-06-01 DIAGNOSIS — I10 Essential (primary) hypertension: Secondary | ICD-10-CM | POA: Diagnosis not present

## 2023-06-01 NOTE — Patient Instructions (Signed)
Follow up appointment: Friday October 4 at 2:30 pm  Take your BP meds as follows:   Decrease nifedipine to 60 mg twice daily  Watch swelling in feet/ankles, hopefully will start to resolve  Check your blood pressure at home daily (if able) and keep record of the readings.  Hypertension "High blood pressure"  Hypertension is often called "The Silent Killer." It rarely causes symptoms until it is extremely  high or has done damage to other organs in the body. For this reason, you should have your  blood pressure checked regularly by your physician. We will check your blood pressure  every time you see a provider at one of our offices.   Your blood pressure reading consists of two numbers. Ideally, blood pressure should be  below 120/80. The first ("top") number is called the systolic pressure. It measures the  pressure in your arteries as your heart beats. The second ("bottom") number is called the diastolic pressure. It measures the pressure in your arteries as the heart relaxes between beats.  The benefits of getting your blood pressure under control are enormous. A 10-point  reduction in systolic blood pressure can reduce your risk of stroke by 27% and heart failure by 28%  Your blood pressure goal is 130/90  To check your pressure at home you will need to:  1. Sit up in a chair, with feet flat on the floor and back supported. Do not cross your ankles or legs. 2. Rest your left arm so that the cuff is about heart level. If the cuff goes on your upper arm,  then just relax the arm on the table, arm of the chair or your lap. If you have a wrist cuff, we  suggest relaxing your wrist against your chest (think of it as Pledging the Flag with the  wrong arm).  3. Place the cuff snugly around your arm, about 1 inch above the crook of your elbow. The  cords should be inside the groove of your elbow.  4. Sit quietly, with the cuff in place, for about 5 minutes. After that 5 minutes  press the power  button to start a reading. 5. Do not talk or move while the reading is taking place.  6. Record your readings on a sheet of paper. Although most cuffs have a memory, it is often  easier to see a pattern developing when the numbers are all in front of you.  7. You can repeat the reading after 1-3 minutes if it is recommended  Make sure your bladder is empty and you have not had caffeine or tobacco within the last 30 min  Always bring your blood pressure log with you to your appointments. If you have not brought your monitor in to be double checked for accuracy, please bring it to your next appointment.  You can find a list of quality blood pressure cuffs at [email or web address]

## 2023-06-01 NOTE — Progress Notes (Signed)
Office Visit    Patient Name: Becky Porter Date of Encounter: 06/01/2023  Primary Care Provider:  Pcp, No Primary Cardiologist:  Thomasene Ripple, DO  Chief Complaint    Hypertension - Cardio-Obstetric  Significant Past Medical History   obesity BMI 60.04 (1 month post delivery)  Alpha thalassemia Silent carrier    No Known Allergies  History of Present Illness    Becky Porter is a 32 y.o. female patient of Dr Servando Salina, in the office today for hypertension follow up.  Ms Consoli delivered a baby girl on 04/15/23 at [redacted]w[redacted]d after induction of labor secondary to chronic hypertension with superimposed preeclampsia.  She was discharged on nifedipine xl 90 mg daily.  She then saw Dr. Servando Salina for follow up one month later.  Pressure was elevated at 132/98.  Nifedipine was increased to 90 mg each morning and 60 mg at night.  She was encouraged to lose weight through diet and exercise.  She is currently breastfeeding her daughter.    Today she tells me that prior to pregnancy her BP was being monitored, but she was not on any medications.  She states doing well, although has noticed some edema in her feet and ankles in the past couple of weeks.  She is getting some sleep, but says the baby is more fussy if not being held, so she's still adjusting to the new schedule.    Blood Pressure Goal:  130/80  Current Medications:  nifedipine xl 90 mg am, 60 mg hs  Family Hx:  grandmother with hypertension, father had kidney disease passed away; none of her half siblings have BP issues   Social Hx:      Tobacco:  no  Alcohol:  occasional glass of wine  Caffeine: coffee and tea regularly  Diet:  mix of home and eating out; out is fast foods - some salads; vegetables usually fresh through [facility], some canned; protein is beef, chicken, only occasional pork, snacks on crackers  Exercise: no regular exercise  Home BP readings:  none with her today    Accessory Clinical Findings    Lab Results  Component  Value Date   CREATININE 0.80 05/16/2023   BUN 14 05/16/2023   NA 139 05/16/2023   K 4.2 05/16/2023   CL 104 05/16/2023   CO2 22 05/16/2023   Lab Results  Component Value Date   ALT 11 05/16/2023   AST 10 05/16/2023   ALKPHOS 99 05/16/2023   BILITOT 0.4 05/16/2023   Lab Results  Component Value Date   HGBA1C 5.4 10/25/2022    Home Medications    Current Outpatient Medications  Medication Sig Dispense Refill   acetaminophen (TYLENOL) 325 MG tablet Take 2 tablets (650 mg total) by mouth every 4 (four) hours as needed (for pain scale < 4). 100 tablet 0   ibuprofen (ADVIL) 600 MG tablet Take 1 tablet (600 mg total) by mouth every 6 (six) hours. 30 tablet 0   NIFEdipine (PROCARDIA XL/NIFEDICAL-XL) 90 MG 24 hr tablet Take 1 tablet (90 mg total) by mouth in the morning. 90 tablet 3   prenatal vitamin w/FE, FA (PRENATAL 1 + 1) 27-1 MG TABS tablet Take 1 tablet by mouth daily at 12 noon.     No current facility-administered medications for this visit.         Assessment & Plan    Benign essential hypertension Assessment: BP is controlled in office BP 115/76 mmHg;   Tolerates nifedipine well, although has recently noted swelling  in her ankles and feet Denies SOB, palpitation, chest pain, headaches Reiterated the importance of regular exercise and low salt diet   Plan:  Decrease nifedipine to 60 mg twice daily Patient to keep record of BP readings with heart rate and report to [place] at the next visit Patient advised to reach out in 2-3 weeks if swelling persistent Patient to follow up with PharmD in 1 month  Labs ordered today:  none   Phillips Hay PharmD CPP [facility]  [address] [place], [place] [address] [phone]

## 2023-06-01 NOTE — Assessment & Plan Note (Signed)
Assessment: BP is controlled in office BP 115/76 mmHg;   Tolerates nifedipine well, although has recently noted swelling in her ankles and feet Denies SOB, palpitation, chest pain, headaches Reiterated the importance of regular exercise and low salt diet   Plan:  Decrease nifedipine to 60 mg twice daily Patient to keep record of BP readings with heart rate and report to [place] at the next visit Patient advised to reach out in 2-3 weeks if swelling persistent Patient to follow up with PharmD in 1 month  Labs ordered today:  none

## 2023-06-16 ENCOUNTER — Encounter: Payer: Self-pay | Admitting: Family Medicine

## 2023-06-16 ENCOUNTER — Ambulatory Visit: Payer: Medicaid Other | Admitting: Family Medicine

## 2023-06-16 VITALS — BP 111/78 | HR 81 | Ht 65.0 in | Wt 362.4 lb

## 2023-06-16 DIAGNOSIS — Z23 Encounter for immunization: Secondary | ICD-10-CM | POA: Diagnosis not present

## 2023-06-16 DIAGNOSIS — Z30431 Encounter for routine checking of intrauterine contraceptive device: Secondary | ICD-10-CM

## 2023-06-16 DIAGNOSIS — O923 Agalactia: Secondary | ICD-10-CM

## 2023-06-16 DIAGNOSIS — Z975 Presence of (intrauterine) contraceptive device: Secondary | ICD-10-CM | POA: Insufficient documentation

## 2023-06-16 NOTE — Progress Notes (Addendum)
   [facility] OFFICE VISIT NOTE  History:   Becky Porter is a 32 y.o. G1P1001 here today for IUD string check.  Has been having some cramping and discomfort Also feels like she has a tampon in, wondering if the IUD is in the proper position  Feels like her milk supply has dropped significantly Would like to see lactation  Health Maintenance Due  Topic Date Due   COVID-19 Vaccine (1) Never done    Past Medical History:  Diagnosis Date   Hypertension    Pregnancy induced hypertension     History reviewed. No pertinent surgical history.  The following portions of the patient's history were reviewed and updated as appropriate: allergies, current medications, past family history, past medical history, past social history, past surgical history and problem list.   Health Maintenance:   Last pap: Lab Results  Component Value Date   DIAGPAP  10/25/2022    - Negative for intraepithelial lesion or malignancy (NILM)   HPVHIGH Negative 10/25/2022    Last mammogram:  N/a    Review of Systems:  Pertinent items noted in HPI and remainder of comprehensive ROS otherwise negative.  Physical Exam:  BP 111/78   Pulse 81   Ht 5\' 5"  (1.651 m)   Wt (!) 362 lb 6.4 oz (164.4 kg)   BMI 60.31 kg/m  CONSTITUTIONAL: Well-developed, well-nourished female in no acute distress.  HEENT:  Normocephalic, atraumatic. External right and left ear normal. No scleral icterus.  NECK: Normal range of motion, supple, no masses noted on observation SKIN: No rash noted. Not diaphoretic. No erythema. No pallor. MUSCULOSKELETAL: Normal range of motion. No edema noted. NEUROLOGIC: Alert and oriented to person, place, and time. Normal muscle tone coordination.  PSYCHIATRIC: Normal mood and affect. Normal behavior. Normal judgment and thought content. RESPIRATORY: Effort normal, no problems with respiration noted ABDOMEN: No masses noted. No other overt distention noted.   PELVIC:  Normal appearing external genitalia; normal appearing vaginal mucosa and cervix.  No abnormal discharge noted. IUD strings visualized and are 3 cm in length as described in insertion note  Labs and Imaging No results found for this or any previous visit (from the past 168 hour(s)). No results found.    Assessment and Plan:   Problem List Items Addressed This Visit       Other   IUD (intrauterine device) in place    No evidence of dislodgement of the IUD but will check with [place] given patient symptoms, plan pending this evaluation.       Relevant Orders   US Transvaginal Non-OB   Other Visit Diagnoses     IUD check up    -  Primary   Flu vaccine need       Relevant Orders   Flu vaccine trivalent PF, 6mos and older(Flulaval,Afluria,Fluarix,Fluzone) (Completed)   Lactation failure       Relevant Orders   Ambulatory referral to Lactation       Routine preventative health maintenance measures emphasized. Please refer to After Visit Summary for other counseling recommendations.   Return for pending results.    Total face-to-face time with patient: 15 minutes.  Over 50% of encounter was spent on counseling and coordination of care.   Venora Maples, MD/MPH Attending Family Medicine Physician, [facility] for [facility], [facility]

## 2023-06-16 NOTE — Assessment & Plan Note (Signed)
No evidence of dislodgement of the IUD but will check with [place] given patient symptoms, plan pending this evaluation.

## 2023-06-21 ENCOUNTER — Ambulatory Visit (HOSPITAL_COMMUNITY)
Admission: RE | Admit: 2023-06-21 | Discharge: 2023-06-21 | Disposition: A | Discharge status: Home / Self Care | Payer: Medicaid Other | Source: Ambulatory Visit | Attending: Family Medicine | Admitting: Family Medicine

## 2023-06-21 DIAGNOSIS — Z975 Presence of (intrauterine) contraceptive device: Secondary | ICD-10-CM | POA: Diagnosis not present

## 2023-06-30 ENCOUNTER — Ambulatory Visit: Payer: Medicaid Other | Attending: Cardiology | Admitting: Pharmacist Clinician (PhC)/ Clinical Pharmacy Specialist

## 2023-06-30 VITALS — BP 122/82 | HR 86

## 2023-06-30 DIAGNOSIS — I10 Essential (primary) hypertension: Secondary | ICD-10-CM

## 2023-06-30 NOTE — Progress Notes (Signed)
Office Visit    Patient Name: Lavaeh Bau Date of Encounter: 06/30/2023  Primary Care Provider:  Pcp, No Primary Cardiologist:  Thomasene Ripple, DO  Chief Complaint    Hypertension - Cardio-Obstetric  Significant Past Medical History   obesity BMI 60.04 (1 month post delivery)  Alpha thalassemia Silent carrier    No Known Allergies  History of Present Illness    Berenise Goldsmith is a 32 y.o. female patient of Dr Servando Salina, in the office today for hypertension follow up.  Ms Yeater delivered a baby girl on 04/15/23 at [redacted]w[redacted]d after induction of labor secondary to chronic hypertension with superimposed preeclampsia.  She was discharged on nifedipine xl 90 mg daily.  She then saw Dr. Servando Salina for follow up one month later.  Pressure was elevated at 132/98.  Nifedipine was increased to 90 mg each morning and 60 mg at night.  She was encouraged to lose weight through diet and exercise.  She is currently breastfeeding her daughter.   When I saw her in September she was doing well, although noticed some edema in her ankles and feet over the previous few weeks.  Sleeping some, but the baby seemed to be more fussy if not being held.  I decreased the nifedipine to 60 mg twice daily.  She returns today for follow up.  Swelling less bothersome, but still present.  She is currently pumping her breast mild and trying various supplements (recommended by lactation RN) to help boost her milk supply.   She does note that she is eating more home cooked foods recently and more conscious of her salt intake.    Blood Pressure Goal:  130/80  Current Medications:  nifedipine xl 60 mg bid  Family Hx:  grandmother with hypertension, father had kidney disease passed away; none of her half siblings have BP issues   Social Hx:      Tobacco:  no  Alcohol:  occasional glass of wine  Caffeine: coffee and tea regularly  Diet:  mix of home and eating out; out is fast foods - some salads; vegetables usually fresh through  [employer], some canned; protein is beef, chicken, only occasional pork, snacks on crackers  Exercise: no regular exercise  Home BP readings:  none with her today    Accessory Clinical Findings    Lab Results  Component Value Date   CREATININE 0.80 05/16/2023   BUN 14 05/16/2023   NA 139 05/16/2023   K 4.2 05/16/2023   CL 104 05/16/2023   CO2 22 05/16/2023   Lab Results  Component Value Date   ALT 11 05/16/2023   AST 10 05/16/2023   ALKPHOS 99 05/16/2023   BILITOT 0.4 05/16/2023   Lab Results  Component Value Date   HGBA1C 5.4 10/25/2022    Home Medications    Current Outpatient Medications  Medication Sig Dispense Refill   acetaminophen (TYLENOL) 325 MG tablet Take 2 tablets (650 mg total) by mouth every 4 (four) hours as needed (for pain scale < 4). 100 tablet 0   ibuprofen (ADVIL) 600 MG tablet Take 1 tablet (600 mg total) by mouth every 6 (six) hours. 30 tablet 0   NIFEdipine (PROCARDIA XL/NIFEDICAL-XL) 90 MG 24 hr tablet Take 1 tablet (90 mg total) by mouth in the morning. 90 tablet 3   prenatal vitamin w/FE, FA (PRENATAL 1 + 1) 27-1 MG TABS tablet Take 1 tablet by mouth daily at 12 noon.     No current facility-administered medications for this visit.  Assessment & Plan    Benign essential hypertension Assessment: BP is controlled in office BP 122/82 mmHg;  Currently pumping breast milk Still some lower extremity edema, notes improved with decrease in nifedipine dose Denies SOB, palpitation, chest pain, headaches,or swelling Reiterated the importance of regular exercise and low salt diet   Plan:  Continue taking nifedipine xl 60 mg bid When discontinues pumping, can consider ARB/thiazide and decrease dose of nifedipine to see if can further decrease edema Patient to keep record of BP readings with heart rate and report to [place] at the next visit Patient to follow up with Dr. Servando Salina in November  Labs ordered today:  none   Phillips Hay PharmD CPP  [facility]  [address] [place], [place] [address] [phone]

## 2023-06-30 NOTE — Patient Instructions (Signed)
Follow up appointment: with Dr. Servando Salina in November  Take your BP meds as follows:  continue with nifedipine xl 60 mg twice daily  Check your blood pressure at home daily (if able) and keep record of the readings.  Hypertension "High blood pressure"  Hypertension is often called "The Silent Killer." It rarely causes symptoms until it is extremely  high or has done damage to other organs in the body. For this reason, you should have your  blood pressure checked regularly by your physician. We will check your blood pressure  every time you see a provider at one of our offices.   Your blood pressure reading consists of two numbers. Ideally, blood pressure should be  below 120/80. The first ("top") number is called the systolic pressure. It measures the  pressure in your arteries as your heart beats. The second ("bottom") number is called the diastolic pressure. It measures the pressure in your arteries as the heart relaxes between beats.  The benefits of getting your blood pressure under control are enormous. A 10-point  reduction in systolic blood pressure can reduce your risk of stroke by 27% and heart failure by 28%  Your blood pressure goal is < 130/80  To check your pressure at home you will need to:  1. Sit up in a chair, with feet flat on the floor and back supported. Do not cross your ankles or legs. 2. Rest your left arm so that the cuff is about heart level. If the cuff goes on your upper arm,  then just relax the arm on the table, arm of the chair or your lap. If you have a wrist cuff, we  suggest relaxing your wrist against your chest (think of it as Pledging the Flag with the  wrong arm).  3. Place the cuff snugly around your arm, about 1 inch above the crook of your elbow. The  cords should be inside the groove of your elbow.  4. Sit quietly, with the cuff in place, for about 5 minutes. After that 5 minutes press the power  button to start a reading. 5. Do not talk or  move while the reading is taking place.  6. Record your readings on a sheet of paper. Although most cuffs have a memory, it is often  easier to see a pattern developing when the numbers are all in front of you.  7. You can repeat the reading after 1-3 minutes if it is recommended  Make sure your bladder is empty and you have not had caffeine or tobacco within the last 30 min  Always bring your blood pressure log with you to your appointments. If you have not brought your monitor in to be double checked for accuracy, please bring it to your next appointment.  You can find a list of quality blood pressure cuffs at validatebp.org

## 2023-06-30 NOTE — Assessment & Plan Note (Signed)
Assessment: BP is controlled in office BP 122/82 mmHg;  Currently pumping breast milk Still some lower extremity edema, notes improved with decrease in nifedipine dose Denies SOB, palpitation, chest pain, headaches,or swelling Reiterated the importance of regular exercise and low salt diet   Plan:  Continue taking nifedipine xl 60 mg bid When discontinues pumping, can consider ARB/thiazide and decrease dose of nifedipine to see if can further decrease edema Patient to keep record of BP readings with heart rate and report to [place] at the next visit Patient to follow up with Dr. Servando Salina in November  Labs ordered today:  none

## 2023-07-04 ENCOUNTER — Other Ambulatory Visit: Payer: Self-pay | Admitting: Lactation Services

## 2023-07-04 MED ORDER — METOCLOPRAMIDE HCL 10 MG PO TABS: 10.0000 mg | ORAL_TABLET | Freq: Three times a day (TID) | ORAL | 0 refills | Status: DC

## 2023-07-04 NOTE — Progress Notes (Unsigned)
Reglan prescribed for low milk supply. Mom informed of side effects of sleepiness, depression and tardive dyskinesia.

## 2023-07-19 NOTE — Progress Notes (Signed)
Spoke with patient by phone to notify her of results and discuss.   She reports she is not feeling the symptoms that she was previously that prompted the [place]. At present happy with this method, will continue with it.

## 2023-07-21 ENCOUNTER — Encounter: Payer: Self-pay | Admitting: Lactation Services

## 2023-08-09 ENCOUNTER — Ambulatory Visit: Payer: Medicaid Other | Attending: Cardiology | Admitting: Cardiology

## 2023-08-09 ENCOUNTER — Encounter: Payer: Self-pay | Admitting: Cardiology

## 2023-08-09 VITALS — BP 132/70 | HR 74 | Ht 65.0 in | Wt 367.8 lb

## 2023-08-09 DIAGNOSIS — I10 Essential (primary) hypertension: Secondary | ICD-10-CM | POA: Diagnosis not present

## 2023-08-09 NOTE — Patient Instructions (Signed)
Medication Instructions:  Your physician recommends that you continue on your current medications as directed. Please refer to the Current Medication list given to you today.  *If you need a refill on your cardiac medications before your next appointment, please call your pharmacy*     Follow-Up: At [facility], you and your health needs are our priority.  As part of our continuing mission to provide you with exceptional heart care, we have created designated Provider Care Teams.  These Care Teams include your primary Cardiologist (physician) and Advanced Practice Providers (APPs -  Physician Assistants and Nurse Practitioners) who all work together to provide you with the care you need, when you need it.  Your next appointment:   12 week(s)  Provider:   Thomasene Ripple, DO

## 2023-08-11 ENCOUNTER — Telehealth: Payer: Medicaid Other | Admitting: Physician Assistant

## 2023-08-11 DIAGNOSIS — Z3200 Encounter for pregnancy test, result unknown: Secondary | ICD-10-CM

## 2023-08-11 DIAGNOSIS — R6889 Other general symptoms and signs: Secondary | ICD-10-CM | POA: Diagnosis not present

## 2023-08-11 DIAGNOSIS — R509 Fever, unspecified: Secondary | ICD-10-CM

## 2023-08-11 DIAGNOSIS — R52 Pain, unspecified: Secondary | ICD-10-CM

## 2023-08-11 DIAGNOSIS — R112 Nausea with vomiting, unspecified: Secondary | ICD-10-CM

## 2023-08-11 DIAGNOSIS — J029 Acute pharyngitis, unspecified: Secondary | ICD-10-CM

## 2023-08-11 MED ORDER — OSELTAMIVIR PHOSPHATE 75 MG PO CAPS: 75.0000 mg | ORAL_CAPSULE | Freq: Two times a day (BID) | ORAL | 0 refills | Status: DC

## 2023-08-11 MED ORDER — ONDANSETRON HCL 4 MG PO TABS: 4.0000 mg | ORAL_TABLET | Freq: Three times a day (TID) | ORAL | 0 refills | Status: DC | PRN

## 2023-08-11 NOTE — Patient Instructions (Signed)
Becky Porter, thank you for joining Margaretann Loveless, PA-C for today's virtual visit.  While this provider is not your primary care provider (PCP), if your PCP is located in our provider database this encounter information will be shared with them immediately following your visit.   A [facility] MyChart account gives you access to today's visit and all your visits, tests, and labs performed at [facility] " click here if you don't have a [facility] MyChart account or go to [email or web address]  Consent: (Patient) Becky Porter provided verbal consent for this virtual visit at the beginning of the encounter.  Current Medications:  Current Outpatient Medications:    ondansetron (ZOFRAN) 4 MG tablet, Take 1 tablet (4 mg total) by mouth every 8 (eight) hours as needed for nausea or vomiting., Disp: 20 tablet, Rfl: 0   oseltamivir (TAMIFLU) 75 MG capsule, Take 1 capsule (75 mg total) by mouth 2 (two) times daily., Disp: 10 capsule, Rfl: 0   acetaminophen (TYLENOL) 325 MG tablet, Take 2 tablets (650 mg total) by mouth every 4 (four) hours as needed (for pain scale < 4)., Disp: 100 tablet, Rfl: 0   ibuprofen (ADVIL) 600 MG tablet, Take 1 tablet (600 mg total) by mouth every 6 (six) hours., Disp: 30 tablet, Rfl: 0   metoCLOPramide (REGLAN) 10 MG tablet, Take 1 tablet (10 mg total) by mouth 3 (three) times daily before meals. Take 3 times a day for 30 days,Then take 2 a day for 4 days, Then take 1 a day for 4 days, Then stop, Disp: 102 tablet, Rfl: 0   NIFEdipine (PROCARDIA XL/NIFEDICAL-XL) 90 MG 24 hr tablet, Take 1 tablet (90 mg total) by mouth in the morning., Disp: 90 tablet, Rfl: 3   prenatal vitamin w/FE, FA (PRENATAL 1 + 1) 27-1 MG TABS tablet, Take 1 tablet by mouth daily at 12 noon., Disp: , Rfl:    Medications ordered in this encounter:  Meds ordered this encounter  Medications   oseltamivir (TAMIFLU) 75 MG capsule    Sig: Take 1 capsule (75 mg total) by mouth 2  (two) times daily.    Dispense:  10 capsule    Refill:  0    Order Specific Question:   Supervising Provider    Answer:   LAMPTEY, PHILIP O [[id number]]   ondansetron (ZOFRAN) 4 MG tablet    Sig: Take 1 tablet (4 mg total) by mouth every 8 (eight) hours as needed for nausea or vomiting.    Dispense:  20 tablet    Refill:  0    Order Specific Question:   Supervising Provider    Answer:   Merrilee Jansky [id number]     *If you need refills on other medications prior to your next appointment, please contact your pharmacy*  Follow-Up: Call back or seek an in-person evaluation if the symptoms worsen or if the condition fails to improve as anticipated.  [facility] [phone]  Other Instructions Influenza, Adult Influenza is also called "the flu." It is an infection in the lungs, nose, and throat (respiratory tract). It spreads easily from person to person (is contagious). The flu causes symptoms that are like a cold, along with high fever and body aches. What are the causes? This condition is caused by the influenza virus. You can get the virus by: Breathing in droplets that are in the air after a person infected with the flu coughed or sneezed. Touching something that has the virus on it and  then touching your mouth, nose, or eyes. What increases the risk? Certain things may make you more likely to get the flu. These include: Not washing your hands often. Having close contact with many people during cold and flu season. Touching your mouth, eyes, or nose without first washing your hands. Not getting a flu shot every year. You may have a higher risk for the flu, and serious problems, such as a lung infection (pneumonia), if you: Are older than 65. Are pregnant. Have a weakened disease-fighting system (immune system) because of a disease or because you are taking certain medicines. Have a long-term (chronic) condition, such as: Heart, kidney, or lung  disease. Diabetes. Asthma. Have a liver disorder. Are very overweight (morbidly obese). Have anemia. What are the signs or symptoms? Symptoms usually begin suddenly and last 4-14 days. They may include: Fever and chills. Headaches, body aches, or muscle aches. Sore throat. Cough. Runny or stuffy (congested) nose. Feeling discomfort in your chest. Not wanting to eat as much as normal. Feeling weak or tired. Feeling dizzy. Feeling sick to your stomach or throwing up. How is this treated? If the flu is found early, you can be treated with antiviral medicine. This can help to reduce how bad the illness is and how long it lasts. This may be given by mouth or through an IV tube. Taking care of yourself at home can help your symptoms get better. Your doctor may want you to: Take over-the-counter medicines. Drink plenty of fluids. The flu often goes away on its own. If you have very bad symptoms or other problems, you may be treated in a hospital. Follow these instructions at home:     Activity Rest as needed. Get plenty of sleep. Stay home from work or school as told by your doctor. Do not leave home until you do not have a fever for 24 hours without taking medicine. Leave home only to go to your doctor. Eating and drinking Take an ORS (oral rehydration solution). This is a drink that is sold at pharmacies and stores. Drink enough fluid to keep your pee pale yellow. Drink clear fluids in small amounts as you are able. Clear fluids include: Water. Ice chips. Fruit juice mixed with water. Low-calorie sports drinks. Eat bland foods that are easy to digest. Eat small amounts as you are able. These foods include: Bananas. Applesauce. Rice. Lean meats. Toast. Crackers. Do not eat or drink: Fluids that have a lot of sugar or caffeine. Alcohol. Spicy or fatty foods. General instructions Take over-the-counter and prescription medicines only as told by your doctor. Use a cool  mist humidifier to add moisture to the air in your home. This can make it easier for you to breathe. When using a cool mist humidifier, clean it daily. Empty water and replace with clean water. Cover your mouth and nose when you cough or sneeze. Wash your hands with soap and water often and for at least 20 seconds. This is also important after you cough or sneeze. If you cannot use soap and water, use alcohol-based hand sanitizer. Keep all follow-up visits. How is this prevented?  Get a flu shot every year. You may get the flu shot in late summer, fall, or winter. Ask your doctor when you should get your flu shot. Avoid contact with people who are sick during fall and winter. This is cold and flu season. Contact a doctor if: You get new symptoms. You have: Chest pain. Watery poop (diarrhea). A fever. Your  cough gets worse. You start to have more mucus. You feel sick to your stomach. You throw up. Get help right away if you: Have shortness of breath. Have trouble breathing. Have skin or nails that turn a bluish color. Have very bad pain or stiffness in your neck. Get a sudden headache. Get sudden pain in your face or ear. Cannot eat or drink without throwing up. These symptoms may represent a serious problem that is an emergency. Get medical help right away. Call your local emergency services (911 in the [place] Do not wait to see if the symptoms will go away. Do not drive yourself to the hospital. Summary Influenza is also called "the flu." It is an infection in the lungs, nose, and throat. It spreads easily from person to person. Take over-the-counter and prescription medicines only as told by your doctor. Getting a flu shot every year is the best way to not get the flu. This information is not intended to replace advice given to you by your health care provider. Make sure you discuss any questions you have with your health care provider. Document Revised: 04/29/2020 Document  Reviewed: 05/01/2020 Elsevier Patient Education  2024 Elsevier Inc.    If you have been instructed to have an in-person evaluation today at a local Urgent Care facility, please use the link below. It will take you to a list of all of our available [facility], including address, phone number and hours of operation. Please do not delay care.  [facility]  If you or a family member do not have a primary care provider, use the link below to schedule a visit and establish care. When you choose a [facility] primary care physician or advanced practice provider, you gain a long-term partner in health. Find a Primary Care Provider  Learn more about [facility]'s in-office and virtual care options: [facility] - Get Care Now

## 2023-08-11 NOTE — Progress Notes (Signed)
I do feel like with you having multiple symptoms, now including body aches, low grade fever, and sore throat, with nausea and vomiting and possible pregnancy that you should be seen in person for a full evaluation.  NOTE: There will be NO CHARGE for this eVisit   If you are having a true medical emergency please call 911.      For an urgent face to face visit, [facility] has eight urgent care centers for your convenience:   NEW!! [facility] Health Urgent Care Center at [employer] Get Driving Directions [phone] [address] [place], [address]    [facility] Health Urgent Care Center at [facility] Get Driving Directions [phone] [address] [place], [place] [address]   [facility] Health Urgent Care Center [facility]) Get Driving Directions [phone] [address] [place], [place] [address]  [facility] Health Urgent Care Center [facility]) Get Driving Directions [phone] [address] [place],  [place]  [address]  [facility] Health Urgent Care Center [facility] - at [employer]  [phone] [address] [place],  [place] [address]   [facility] Health Urgent Care at [facility] Get Driving Directions [phone] [address] [place], [place] [address]   [facility] Health Urgent Care at [facility] Get Driving Directions  [phone] [address] [place], [place] [address]   [facility] Health Urgent Care at [facility] Directions [phone] [address] [place], [place] [address]  Your MyChart E-visit questionnaire answers were reviewed by a board certified advanced clinical practitioner to complete your personal care plan based on your specific symptoms.  Thank you for using e-Visits.    I have spent 5 minutes in review of e-visit questionnaire, review and updating patient chart, medical decision making and response to  patient.   Margaretann Loveless, PA-C

## 2023-08-11 NOTE — Progress Notes (Signed)
Virtual Visit Consent   Becky Porter, you are scheduled for a virtual visit with a [facility] provider today. Just as with appointments in the office, your consent must be obtained to participate. Your consent will be active for this visit and any virtual visit you may have with one of our providers in the next 365 days. If you have a MyChart account, a copy of this consent can be sent to you electronically.  As this is a virtual visit, video technology does not allow for your provider to perform a traditional examination. This may limit your provider's ability to fully assess your condition. If your provider identifies any concerns that need to be evaluated in person or the need to arrange testing (such as labs, EKG, etc.), we will make arrangements to do so. Although advances in technology are sophisticated, we cannot ensure that it will always work on either your end or our end. If the connection with a video visit is poor, the visit may have to be switched to a telephone visit. With either a video or telephone visit, we are not always able to ensure that we have a secure connection.  By engaging in this virtual visit, you consent to the provision of healthcare and authorize for your insurance to be billed (if applicable) for the services provided during this visit. Depending on your insurance coverage, you may receive a charge related to this service.  I need to obtain your verbal consent now. Are you willing to proceed with your visit today? [name] has provided verbal consent on 08/11/2023 for a virtual visit (video or telephone). Margaretann Loveless, PA-C  Date: 08/11/2023 9:33 AM  Virtual Visit via Video Note   I, Margaretann Loveless, connected with  Becky Porter  ([id number], 03-21-1991) on 08/11/23 at  9:30 AM EST by a video-enabled telemedicine application and verified that I am speaking with the correct person using two identifiers.  Location: Patient: Virtual Visit  Location Patient: Home Provider: Virtual Visit Location Provider: Home Office   I discussed the limitations of evaluation and management by telemedicine and the availability of in person appointments. The patient expressed understanding and agreed to proceed.    History of Present Illness: Becky Porter is a 32 y.o. who identifies as a female who was assigned female at birth, and is being seen today for flu-like illness.  HPI: Influenza This is a new problem. The current episode started in the past 7 days (2 days). The problem occurs constantly. The problem has been gradually worsening. Associated symptoms include chills, congestion, coughing (mild), diaphoresis, fatigue, headaches, myalgias, nausea, a sore throat (mild) and vomiting. Pertinent negatives include no chest pain or fever. She has tried NSAIDs and rest for the symptoms. The treatment provided no relief.    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  Problems:  Patient Active Problem List   Diagnosis Date Noted   IUD (intrauterine device) in place 06/16/2023   Alpha thalassemia silent carrier 11/09/2022   Benign essential hypertension 10/22/2022   BMI 60.0-69.9, adult (HCC) 10/22/2022    Allergies: No Known Allergies Medications:  Current Outpatient Medications:    ondansetron (ZOFRAN) 4 MG tablet, Take 1 tablet (4 mg total) by mouth every 8 (eight) hours as needed for nausea or vomiting., Disp: 20 tablet, Rfl: 0   oseltamivir (TAMIFLU) 75 MG capsule, Take 1 capsule (75 mg total) by mouth 2 (two) times daily., Disp: 10 capsule, Rfl: 0   acetaminophen (TYLENOL) 325  MG tablet, Take 2 tablets (650 mg total) by mouth every 4 (four) hours as needed (for pain scale < 4)., Disp: 100 tablet, Rfl: 0   ibuprofen (ADVIL) 600 MG tablet, Take 1 tablet (600 mg total) by mouth every 6 (six) hours., Disp: 30 tablet, Rfl: 0   metoCLOPramide (REGLAN) 10 MG tablet, Take 1 tablet (10 mg total) by mouth 3 (three) times daily before  meals. Take 3 times a day for 30 days,Then take 2 a day for 4 days, Then take 1 a day for 4 days, Then stop, Disp: 102 tablet, Rfl: 0   NIFEdipine (PROCARDIA XL/NIFEDICAL-XL) 90 MG 24 hr tablet, Take 1 tablet (90 mg total) by mouth in the morning., Disp: 90 tablet, Rfl: 3   prenatal vitamin w/FE, FA (PRENATAL 1 + 1) 27-1 MG TABS tablet, Take 1 tablet by mouth daily at 12 noon., Disp: , Rfl:   Observations/Objective: Patient is well-developed, well-nourished in no acute distress.  Resting comfortably at home.  Head is normocephalic, atraumatic.  No labored breathing.  Speech is clear and coherent with logical content.  Patient is alert and oriented at baseline.    Assessment and Plan: 1. Flu-like symptoms - oseltamivir (TAMIFLU) 75 MG capsule; Take 1 capsule (75 mg total) by mouth 2 (two) times daily.  Dispense: 10 capsule; Refill: 0  2. Nausea and vomiting, unspecified vomiting type - ondansetron (ZOFRAN) 4 MG tablet; Take 1 tablet (4 mg total) by mouth every 8 (eight) hours as needed for nausea or vomiting.  Dispense: 20 tablet; Refill: 0  - Suspect influenza due to symptoms and positive exposure - Tamiflu prescribed - Zofran for nausea - Continue OTC medication of choice for symptomatic management - Push fluids - Rest - Seek in person evaluation if symptoms worsen or fail to improve   Follow Up Instructions: I discussed the assessment and treatment plan with the patient. The patient was provided an opportunity to ask questions and all were answered. The patient agreed with the plan and demonstrated an understanding of the instructions.  A copy of instructions were sent to the patient via MyChart unless otherwise noted below.    The patient was advised to call back or seek an in-person evaluation if the symptoms worsen or if the condition fails to improve as anticipated.    Margaretann Loveless, PA-C

## 2023-08-11 NOTE — Progress Notes (Signed)
Cardio-Obstetrics Clinic  New Evaluation  Date:  08/11/2023   ID:  Becky Porter, DOB 1991-03-25, MRN [id number]  PCP:  Aviva Kluver   [facility] Providers Cardiologist:  Thomasene Ripple, DO  Electrophysiologist:  None       Referring MD: No ref. provider found   Chief Complaint: I am doing okay  History of Present Illness:    Becky Porter is a 32 y.o. female [G1P1001] who is being seen today in follow up.   Medical history include chronic hypertension, morbid obesity here today to be evaluated for elevated blood pressure. She is doing well, back to work.    Prior CV Studies Reviewed: The following studies were reviewed today: None available  Past Medical History:  Diagnosis Date   Hypertension    Pregnancy induced hypertension     No past surgical history on file.    OB History     Gravida  1   Para  1   Term  1   Preterm      AB      Living  1      SAB      IAB      Ectopic      Multiple  0   Live Births  1               Current Medications: Current Meds  Medication Sig   acetaminophen (TYLENOL) 325 MG tablet Take 2 tablets (650 mg total) by mouth every 4 (four) hours as needed (for pain scale < 4).   ibuprofen (ADVIL) 600 MG tablet Take 1 tablet (600 mg total) by mouth every 6 (six) hours.   metoCLOPramide (REGLAN) 10 MG tablet Take 1 tablet (10 mg total) by mouth 3 (three) times daily before meals. Take 3 times a day for 30 days,Then take 2 a day for 4 days, Then take 1 a day for 4 days, Then stop   NIFEdipine (PROCARDIA XL/NIFEDICAL-XL) 90 MG 24 hr tablet Take 1 tablet (90 mg total) by mouth in the morning.   prenatal vitamin w/FE, FA (PRENATAL 1 + 1) 27-1 MG TABS tablet Take 1 tablet by mouth daily at 12 noon.     Allergies:   Patient has no known allergies.   Social History   Socioeconomic History   Marital status: Significant Other    Spouse name: Not on file   Number of children: Not on file   Years of education:  Not on file   Highest education level: Some college, no degree  Occupational History   Not on file  Tobacco Use   Smoking status: Never   Smokeless tobacco: Not on file  Vaping Use   Vaping status: Never Used  Substance and Sexual Activity   Alcohol use: Not Currently    Comment: social   Drug use: No   Sexual activity: Not Currently    Birth control/protection: None  Other Topics Concern   Not on file  Social History Narrative   Not on file   Social Determinants of Health   Financial Resource Strain: Medium Risk (01/23/2023)   Overall Financial Resource Strain (CARDIA)    Difficulty of Paying Living Expenses: Somewhat hard  Food Insecurity: Food Insecurity Present (01/23/2023)   Hunger Vital Sign    Worried About Running Out of Food in the Last Year: Sometimes true    Ran Out of Food in the Last Year: Sometimes true  Transportation Needs: No Transportation Needs (01/23/2023)   PRAPARE -  Administrator, Civil Service (Medical): No    Lack of Transportation (Non-Medical): No  Recent Concern: Transportation Needs - Unmet Transportation Needs (01/23/2023)   PRAPARE - Transportation    Lack of Transportation (Medical): No    Lack of Transportation (Non-Medical): Yes  Physical Activity: Insufficiently Active (01/23/2023)   Exercise Vital Sign    Days of Exercise per Week: 3 days    Minutes of Exercise per Session: 10 min  Stress: Stress Concern Present (01/23/2023)   [employer] of Occupational Health - Occupational Stress Questionnaire    Feeling of Stress : Rather much  Social Connections: Moderately Isolated (01/23/2023)   Social Connection and Isolation Panel [NHANES]    Frequency of Communication with Friends and Family: More than three times a week    Frequency of Social Gatherings with Friends and Family: Twice a week    Attends Religious Services: 1 to 4 times per year    Active Member of [employer] or Organizations: No    Attends Hospital doctor: Not on file    Marital Status: Never married      Family History  Problem Relation Age of Onset   Kidney disease Father    Diabetes Maternal Grandmother    Stroke Maternal Grandmother    Stroke Paternal Grandmother    Cancer Paternal Grandfather    Asthma Neg Hx       ROS:   Please see the history of present illness.     All other systems reviewed and are negative.   Labs/EKG Reviewed:    EKG:   EKG is was not ordered today.    Recent Labs: 04/15/2023: Hemoglobin 11.5; Platelets 282 05/16/2023: ALT 11; BUN 14; Creatinine, Ser 0.80; Magnesium 1.8; Potassium 4.2; Sodium 139   Recent Lipid Panel No results found for: "CHOL", "TRIG", "HDL", "CHOLHDL", "LDLCALC", "LDLDIRECT"  Physical Exam:    VS:  BP 132/70   Pulse 74   Ht 5\' 5"  (1.651 m)   Wt (!) 367 lb 12.8 oz (166.8 kg)   SpO2 99%   BMI 61.21 kg/m     Wt Readings from Last 3 Encounters:  08/09/23 (!) 367 lb 12.8 oz (166.8 kg)  06/16/23 (!) 362 lb 6.4 oz (164.4 kg)  05/16/23 (!) 360 lb 12.8 oz (163.7 kg)     GEN:  Well nourished, well developed in no acute distress HEENT: Normal NECK: No JVD; No carotid bruits LYMPHATICS: No lymphadenopathy CARDIAC: RRR, no murmurs, rubs, gallops RESPIRATORY:  Clear to auscultation without rales, wheezing or rhonchi  ABDOMEN: Soft, non-tender, non-distended MUSCULOSKELETAL:  No edema; No deformity  SKIN: Warm and dry NEUROLOGIC:  Alert and oriented x 3 PSYCHIATRIC:  Normal affect    Risk Assessment/Risk Calculators:                  ASSESSMENT & PLAN:    Chronic hypertension  Morbid obesity  Blood pressure at target today. No medication change   The patient understands the need to lose weight with diet and exercise. We have discussed specific strategies for this.   The patient understands the need to lose weight with diet and exercise. We have discussed specific strategies for this.    Patient Instructions  Medication Instructions:  Your  physician recommends that you continue on your current medications as directed. Please refer to the Current Medication list given to you today.  *If you need a refill on your cardiac medications before your next appointment, please call your  pharmacy*     Follow-Up: At [facility], you and your health needs are our priority.  As part of our continuing mission to provide you with exceptional heart care, we have created designated Provider Care Teams.  These Care Teams include your primary Cardiologist (physician) and Advanced Practice Providers (APPs -  Physician Assistants and Nurse Practitioners) who all work together to provide you with the care you need, when you need it.  Your next appointment:   12 week(s)  Provider:   Thomasene Ripple, DO      Dispo:  No follow-ups on file.   Medication Adjustments/Labs and Tests Ordered: Current medicines are reviewed at length with the patient today.  Concerns regarding medicines are outlined above.  Tests Ordered: Orders Placed This Encounter  Procedures   EKG 12-Lead   Medication Changes: No orders of the defined types were placed in this encounter.

## 2023-08-11 NOTE — Progress Notes (Signed)
Because there may be a possibility of pregnancy with nausea and vomiting symptoms, I feel your condition warrants further evaluation and I recommend that you be seen in a face to face visit.   NOTE: There will be NO CHARGE for this eVisit   If you are having a true medical emergency please call 911.      For an urgent face to face visit, [place] has eight urgent care centers for your convenience:   NEW!! [facility] Health Urgent Care Center at [facility] Get Driving Directions [phone] [address] [place], [address]    [facility] Health Urgent Care Center at [facility] Get Driving Directions [phone] [address] [place], [place] [address]   [facility] Health Urgent Care Center [facility] Get Driving Directions [phone] [address] [place], [place] [address]  [facility] Urgent Care Center [facility]) Get Driving Directions [phone] [address] [place],  [place]  [address]  [facility] Health Urgent Care Center [facility] - at [employer]  [phone] [address] [place],  [place] [address]   [facility] Health Urgent Care at [facility] Get Driving Directions [phone] [address] [place], [place] [address]   [facility] Health Urgent Care at [facility] Get Driving Directions  [phone] [address] [place], [place] [address]   [facility] Health Urgent Care at [facility] Directions [phone] [address] [place], [place] [address]  Your MyChart E-visit questionnaire answers were reviewed by a board certified advanced clinical practitioner to complete your personal care plan based on your specific symptoms.  Thank you for using e-Visits.    I have spent 5 minutes in review of e-visit questionnaire, review and updating patient chart, medical decision making and response to patient.   Margaretann Loveless, PA-C

## 2023-10-31 NOTE — Progress Notes (Signed)
 Appt 11/02/23  Hi Dr. Sheena and Jasmine,  Becky Porter has been identified as a patient that could benefit from health coaching for healthy eating and physical activity. Discuss with patient their interest in participating in the free health coaching program and refer to REF 2201/Care Navigation.  Greig Ruth [phone] [email or web address] Care GuideHealth Coach

## 2023-11-02 ENCOUNTER — Ambulatory Visit: Payer: 59 | Attending: Cardiology | Admitting: Cardiology

## 2023-11-02 ENCOUNTER — Encounter: Payer: Self-pay | Admitting: Cardiology

## 2023-11-02 VITALS — BP 138/100 | HR 78 | Ht 65.0 in | Wt 369.8 lb

## 2023-11-02 DIAGNOSIS — Z79899 Other long term (current) drug therapy: Secondary | ICD-10-CM | POA: Diagnosis not present

## 2023-11-02 DIAGNOSIS — I10 Essential (primary) hypertension: Secondary | ICD-10-CM

## 2023-11-02 LAB — COMPREHENSIVE METABOLIC PANEL
ALT: 14 [IU]/L (ref 0–32)
AST: 15 [IU]/L (ref 0–40)
Albumin: 4 g/dL (ref 3.9–4.9)
Alkaline Phosphatase: 102 [IU]/L (ref 44–121)
BUN/Creatinine Ratio: 19 (ref 9–23)
BUN: 12 mg/dL (ref 6–20)
Bilirubin Total: 0.7 mg/dL (ref 0.0–1.2)
CO2: 21 mmol/L (ref 20–29)
Calcium: 8.9 mg/dL (ref 8.7–10.2)
Chloride: 104 mmol/L (ref 96–106)
Creatinine, Ser: 0.63 mg/dL (ref 0.57–1.00)
Globulin, Total: 3 g/dL (ref 1.5–4.5)
Glucose: 87 mg/dL (ref 70–99)
Potassium: 4.3 mmol/L (ref 3.5–5.2)
Sodium: 138 mmol/L (ref 134–144)
Total Protein: 7 g/dL (ref 6.0–8.5)
eGFR: 121 mL/min/{1.73_m2} (ref >59)

## 2023-11-02 LAB — MAGNESIUM: Magnesium: 1.9 mg/dL (ref 1.6–2.3)

## 2023-11-02 MED ORDER — HYDROCHLOROTHIAZIDE 12.5 MG PO CAPS: 12.5000 mg | ORAL_CAPSULE | Freq: Every day | ORAL | 3 refills | Status: DC

## 2023-11-02 NOTE — Progress Notes (Signed)
 Cardio-Obstetrics Clinic  New Evaluation  Date:  11/04/2023   ID:  Becky Porter, DOB 03-03-1991, MRN [id number]  PCP:  Freddrick Johns   [facility] Providers Cardiologist:  Dub Huntsman, DO  Electrophysiologist:  None       Referring MD: No ref. provider found   Chief Complaint: I am doing okay  History of Present Illness:    Becky Porter is a 33 y.o. female [G1P1001] who is being seen today in follow up.   Medical history include chronic hypertension, morbid obesity here today to be evaluated for elevated blood pressure. She is doing well, back to work. She is here with her daughter.    Prior CV Studies Reviewed: The following studies were reviewed today: None available  Past Medical History:  Diagnosis Date   Hypertension    Pregnancy induced hypertension     No past surgical history on file.    OB History     Gravida  1   Para  1   Term  1   Preterm      AB      Living  1      SAB      IAB      Ectopic      Multiple  0   Live Births  1               Current Medications: Current Meds  Medication Sig   acetaminophen  (TYLENOL ) 325 MG tablet Take 2 tablets (650 mg total) by mouth every 4 (four) hours as needed (for pain scale < 4).   hydrochlorothiazide  (MICROZIDE ) 12.5 MG capsule Take 1 capsule (12.5 mg total) by mouth daily.   ibuprofen  (ADVIL ) 600 MG tablet Take 1 tablet (600 mg total) by mouth every 6 (six) hours.   metoCLOPramide  (REGLAN ) 10 MG tablet Take 1 tablet (10 mg total) by mouth 3 (three) times daily before meals. Take 3 times a day for 30 days,Then take 2 a day for 4 days, Then take 1 a day for 4 days, Then stop   ondansetron  (ZOFRAN ) 4 MG tablet Take 1 tablet (4 mg total) by mouth every 8 (eight) hours as needed for nausea or vomiting.   oseltamivir  (TAMIFLU ) 75 MG capsule Take 1 capsule (75 mg total) by mouth 2 (two) times daily.   prenatal vitamin w/FE, FA (PRENATAL 1 + 1) 27-1 MG TABS tablet Take 1 tablet by mouth  daily at 12 noon.   [DISCONTINUED] NIFEdipine  (PROCARDIA  XL/NIFEDICAL-XL) 90 MG 24 hr tablet Take 1 tablet (90 mg total) by mouth in the morning.     Allergies:   Patient has no known allergies.   Social History   Socioeconomic History   Marital status: Significant Other    Spouse name: Not on file   Number of children: Not on file   Years of education: Not on file   Highest education level: Some college, no degree  Occupational History   Not on file  Tobacco Use   Smoking status: Never   Smokeless tobacco: Not on file  Vaping Use   Vaping status: Never Used  Substance and Sexual Activity   Alcohol use: Not Currently    Comment: social   Drug use: No   Sexual activity: Not Currently    Birth control/protection: None  Other Topics Concern   Not on file  Social History Narrative   Not on file   Social Drivers of Health   Financial Resource Strain: Medium Risk (01/23/2023)  Overall Financial Resource Strain (CARDIA)    Difficulty of Paying Living Expenses: Somewhat hard  Food Insecurity: Food Insecurity Present (01/23/2023)   Hunger Vital Sign    Worried About Running Out of Food in the Last Year: Sometimes true    Ran Out of Food in the Last Year: Sometimes true  Transportation Needs: No Transportation Needs (01/23/2023)   PRAPARE - Administrator, Civil Service (Medical): No    Lack of Transportation (Non-Medical): No  Recent Concern: Transportation Needs - Unmet Transportation Needs (01/23/2023)   PRAPARE - Transportation    Lack of Transportation (Medical): No    Lack of Transportation (Non-Medical): Yes  Physical Activity: Insufficiently Active (01/23/2023)   Exercise Vital Sign    Days of Exercise per Week: 3 days    Minutes of Exercise per Session: 10 min  Stress: Stress Concern Present (01/23/2023)   [employer] of Occupational Health - Occupational Stress Questionnaire    Feeling of Stress : Rather much  Social Connections: Moderately Isolated  (01/23/2023)   Social Connection and Isolation Panel [NHANES]    Frequency of Communication with Friends and Family: More than three times a week    Frequency of Social Gatherings with Friends and Family: Twice a week    Attends Religious Services: 1 to 4 times per year    Active Member of [employer] or Organizations: No    Attends Engineer, Structural: Not on file    Marital Status: Never married      Family History  Problem Relation Age of Onset   Kidney disease Father    Diabetes Maternal Grandmother    Stroke Maternal Grandmother    Stroke Paternal Grandmother    Cancer Paternal Grandfather    Asthma Neg Hx       ROS:   Please see the history of present illness.     All other systems reviewed and are negative.   Labs/EKG Reviewed:    EKG:   EKG is was not ordered today.    Recent Labs: 04/15/2023: Hemoglobin 11.5; Platelets 282 11/02/2023: ALT 14; BUN 12; Creatinine, Ser 0.63; Magnesium  1.9; Potassium 4.3; Sodium 138   Recent Lipid Panel No results found for: CHOL, TRIG, HDL, CHOLHDL, LDLCALC, LDLDIRECT  Physical Exam:    VS:  BP (!) 138/100 (BP Location: Right Arm, Patient Position: Sitting, Cuff Size: Large)   Pulse 78   Ht 5' 5 (1.651 m)   Wt (!) 369 lb 12.8 oz (167.7 kg)   SpO2 98%   BMI 61.54 kg/m     Wt Readings from Last 3 Encounters:  11/02/23 (!) 369 lb 12.8 oz (167.7 kg)  08/09/23 (!) 367 lb 12.8 oz (166.8 kg)  06/16/23 (!) 362 lb 6.4 oz (164.4 kg)     GEN:  Well nourished, well developed in no acute distress HEENT: Normal NECK: No JVD; No carotid bruits LYMPHATICS: No lymphadenopathy CARDIAC: RRR, no murmurs, rubs, gallops RESPIRATORY:  Clear to auscultation without rales, wheezing or rhonchi  ABDOMEN: Soft, non-tender, non-distended MUSCULOSKELETAL:  No edema; No deformity  SKIN: Warm and dry NEUROLOGIC:  Alert and oriented x 3 PSYCHIATRIC:  Normal affect    Risk Assessment/Risk Calculators:                   ASSESSMENT & PLAN:    Chronic hypertension  Morbid obesity  Her blood pressure is elevated today.  Will stop nifedipine  and start the patient on hydrochlorothiazide  12.5 mg daily.  She is  no longer breast-feeding The patient understands the need to lose weight with diet and exercise. We have discussed specific strategies for this.   The patient understands the need to lose weight with diet and exercise. We have discussed specific strategies for this.    Patient Instructions  Medication Instructions:  Your physician has recommended you make the following change in your medication:  STOP: Nifedipine   START: Hydrochlorothiazide  12.5 mg once daily  *If you need a refill on your cardiac medications before your next appointment, please call your pharmacy*   Lab Work: CMET, Mag If you have labs (blood work) drawn today and your tests are completely normal, you will receive your results only by: MyChart Message (if you have MyChart) OR A paper copy in the mail If you have any lab test that is abnormal or we need to change your treatment, we will call you to review the results.   Follow-Up: At [facility], you and your health needs are our priority.  As part of our continuing mission to provide you with exceptional heart care, we have created designated Provider Care Teams.  These Care Teams include your primary Cardiologist (physician) and Advanced Practice Providers (APPs -  Physician Assistants and Nurse Practitioners) who all work together to provide you with the care you need, when you need it.  Your next appointment:   16 week(s)  Provider:   Mariana Wiederholt, DO     Other Instructions Please see Pharm-D in 4 weeks.        Dispo:  No follow-ups on file.   Medication Adjustments/Labs and Tests Ordered: Current medicines are reviewed at length with the patient today.  Concerns regarding medicines are outlined above.  Tests Ordered: Orders Placed This Encounter   Procedures   Comprehensive Metabolic Panel (CMET)   Magnesium    Medication Changes: Meds ordered this encounter  Medications   hydrochlorothiazide  (MICROZIDE ) 12.5 MG capsule    Sig: Take 1 capsule (12.5 mg total) by mouth daily.    Dispense:  90 capsule    Refill:  3

## 2023-11-02 NOTE — Patient Instructions (Signed)
 Medication Instructions:  Your physician has recommended you make the following change in your medication:  STOP: Nifedipine   START: Hydrochlorothiazide  12.5 mg once daily  *If you need a refill on your cardiac medications before your next appointment, please call your pharmacy*   Lab Work: CMET, Mag If you have labs (blood work) drawn today and your tests are completely normal, you will receive your results only by: MyChart Message (if you have MyChart) OR A paper copy in the mail If you have any lab test that is abnormal or we need to change your treatment, we will call you to review the results.   Follow-Up: At [facility], you and your health needs are our priority.  As part of our continuing mission to provide you with exceptional heart care, we have created designated Provider Care Teams.  These Care Teams include your primary Cardiologist (physician) and Advanced Practice Providers (APPs -  Physician Assistants and Nurse Practitioners) who all work together to provide you with the care you need, when you need it.  Your next appointment:   16 week(s)  Provider:   Kardie Tobb, DO     Other Instructions Please see Pharm-D in 4 weeks.

## 2023-11-06 ENCOUNTER — Encounter: Payer: Self-pay | Admitting: Cardiology

## 2023-12-13 ENCOUNTER — Telehealth: Admitting: Family Medicine

## 2023-12-13 ENCOUNTER — Ambulatory Visit
Admission: EM | Admit: 2023-12-13 | Discharge: 2023-12-13 | Disposition: A | Discharge status: Home / Self Care | Attending: Nurse Practitioner | Admitting: Nurse Practitioner

## 2023-12-13 ENCOUNTER — Encounter: Payer: Self-pay | Admitting: Family Medicine

## 2023-12-13 DIAGNOSIS — R197 Diarrhea, unspecified: Secondary | ICD-10-CM

## 2023-12-13 DIAGNOSIS — K529 Noninfective gastroenteritis and colitis, unspecified: Secondary | ICD-10-CM | POA: Diagnosis not present

## 2023-12-13 DIAGNOSIS — I10 Essential (primary) hypertension: Secondary | ICD-10-CM

## 2023-12-13 LAB — POCT URINALYSIS DIP (MANUAL ENTRY)
Bilirubin, UA: NEGATIVE
Blood, UA: NEGATIVE
Glucose, UA: NEGATIVE mg/dL
Ketones, POC UA: NEGATIVE mg/dL
Leukocytes, UA: NEGATIVE
Nitrite, UA: NEGATIVE
Protein Ur, POC: NEGATIVE mg/dL
Spec Grav, UA: 1.02 (ref 1.010–1.025)
Urobilinogen, UA: 0.2 U/dL
pH, UA: 8.5 — AB (ref 5.0–8.0)

## 2023-12-13 LAB — POCT URINE PREGNANCY: Preg Test, Ur: NEGATIVE

## 2023-12-13 MED ORDER — ONDANSETRON 4 MG PO TBDP
4.0000 mg | ORAL_TABLET | Freq: Once | ORAL | Status: AC
  Administered 2023-12-13: 4 mg via ORAL

## 2023-12-13 MED ORDER — ONDANSETRON 8 MG PO TBDP: 8.0000 mg | ORAL_TABLET | Freq: Three times a day (TID) | ORAL | 0 refills | Status: DC | PRN

## 2023-12-13 NOTE — Discharge Instructions (Addendum)
 You were seen today for nausea, vomiting, diarrhea, and headache, likely caused by a norovirus infection. Norovirus affects the stomach and intestines and spreads easily, especially in places like daycare centers, where others have similar symptoms. There is no specific treatment for norovirus, but symptoms usually improve in a few days.  The key to recovery is staying hydrated. Continue drinking fluids like fruit juice, Gatorade, and ginger ale, which you've tolerated well. You can gradually add bland foods such as bananas, rice, applesauce, and toast. Avoid spicy, greasy, fried foods, and dairy.  Make sure to wash your hands often and disinfect frequently touched surfaces like counters, doorknobs, and faucets using a bleach-based cleaner. Wash your bed linens in hot water to reduce the risk of spreading the virus.

## 2023-12-13 NOTE — ED Provider Notes (Signed)
 [facility]    CSN: [id number] Arrival date & time: 12/13/23  0847      History   Chief Complaint Chief Complaint  Patient presents with   Emesis   Diarrhea   Headache   Abdominal Pain    HPI Becky Porter is a 33 y.o. female.   History of Present Illness  Becky Porter is a 34 y.o. female that presents for evaluation of nausea, diarrhea, and headache. Onset of symptoms was 6 days ago, and has pretty much been unchanged since that time. Patient reports vomiting that only started one day ago as well as chills. She denies any fevers, body aches, abdominal pain, dysuria, black stools or bloody stools. 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 She denies loss of appetite but reports feeling scared to eat. She has been able to tolerate fruit, gatorade and ginger-ale without difficulty. There is no prior history of gastrointestinal disease. No known risk factors for parasitic or bacterial infection.           Past Medical History:  Diagnosis Date   Hypertension    Pregnancy induced hypertension     Patient Active Problem List   Diagnosis Date Noted   IUD (intrauterine device) in place 06/16/2023   Alpha thalassemia silent carrier 11/09/2022   Benign essential hypertension 10/22/2022   BMI 60.0-69.9, adult (HCC) 10/22/2022   Encounter for IUD insertion 08/03/2020   Increased frequency of urination 03/18/2020   Encounter for Papanicolaou smear for cervical cancer screening 03/18/2020   Amenorrhea 12/17/2019   Chronic migraine without aura without status migrainosus, not intractable 11/01/2019   Current moderate episode of major depressive disorder without prior episode (HCC) 11/01/2019   Elevated LFTs 11/01/2019   Positive depression screening 11/01/2019   Encounter for other general counseling and advice on contraception 11/01/2019    Chronic daily headache 07/02/2019   Need for immunization against influenza 07/02/2019   Morbid obesity with BMI of 60.0-69.9, adult (HCC) 07/02/2019   Encounter for hepatitis C screening test for low risk patient 07/02/2019   Dietary counseling 07/02/2019   Screening for diabetes mellitus 07/02/2019   Exercise counseling 07/02/2019   Encounter for screening for HIV 07/02/2019   Seasonal allergic rhinitis due to pollen 07/02/2019    History reviewed. No pertinent surgical history.  OB History     Gravida  1   Para  1   Term  1   Preterm      AB      Living  1      SAB      IAB      Ectopic      Multiple  0   Live Births  1            Home Medications    Prior to Admission medications   Medication Sig Start Date End Date Taking? Authorizing Provider  ondansetron (ZOFRAN-ODT) 8 MG disintegrating tablet Take 1 tablet (8 mg total) by mouth every 8 (eight) hours as needed for nausea or vomiting. 12/13/23  Yes Lurline Idol, FNP  hydrochlorothiazide (MICROZIDE) 12.5 MG capsule Take 1 capsule (12.5 mg total) by mouth daily. 11/02/23 01/31/24  Thomasene Ripple, DO    Family History Family History  Problem Relation Age of Onset   Kidney disease Father    Diabetes Maternal Grandmother    Stroke Maternal Grandmother  Stroke Paternal Grandmother    Cancer Paternal Grandfather    Asthma Neg Hx     Social History Social History   Tobacco Use   Smoking status: Never   Smokeless tobacco: Never  Vaping Use   Vaping status: Never Used  Substance Use Topics   Alcohol use: Not Currently    Comment: social   Drug use: No     Allergies   Patient has no known allergies.   Review of Systems Review of Systems  Constitutional:  Positive for appetite change and chills. Negative for activity change and fever.  HENT: Negative.    Respiratory: Negative.    Cardiovascular: Negative.   Gastrointestinal:  Positive for diarrhea, nausea and vomiting. Negative for  abdominal pain and blood in stool.  Genitourinary:  Negative for dysuria.  Musculoskeletal:  Negative for myalgias.  Neurological:  Positive for headaches. Negative for dizziness.  All other systems reviewed and are negative.    Physical Exam Triage Vital Signs ED Triage Vitals  Encounter Vitals Group     BP 12/13/23 0906 125/85     Systolic BP Percentile --      Diastolic BP Percentile --      Pulse Rate 12/13/23 0906 83     Resp 12/13/23 0906 20     Temp 12/13/23 0906 98.1 F (36.7 C)     Temp Source 12/13/23 0906 Oral     SpO2 12/13/23 0906 97 %     Weight 12/13/23 0903 (!) 370 lb (167.8 kg)     Height 12/13/23 0903 5\' 5"  (1.651 m)     Head Circumference --      Peak Flow --      Pain Score 12/13/23 0900 2     Pain Loc --      Pain Education --      Exclude from Growth Chart --    No data found.  Updated Vital Signs BP 125/85 (BP Location: Right Arm)   Pulse 83   Temp 98.1 F (36.7 C) (Oral)   Resp 20   Ht 5\' 5"  (1.651 m)   Wt (!) 370 lb (167.8 kg)   LMP  (LMP Unknown)   SpO2 97%   BMI 61.57 kg/m   Visual Acuity Right Eye Distance:   Left Eye Distance:   Bilateral Distance:    Right Eye Near:   Left Eye Near:    Bilateral Near:     Physical Exam Vitals and nursing note reviewed.  Constitutional:      General: She is not in acute distress.    Appearance: Normal appearance. She is obese. She is not ill-appearing or toxic-appearing.  HENT:     Head: Normocephalic.     Mouth/Throat:     Mouth: Mucous membranes are moist.  Cardiovascular:     Rate and Rhythm: Normal rate.     Heart sounds: Normal heart sounds.  Pulmonary:     Breath sounds: Normal breath sounds.  Abdominal:     General: There is no distension.     Palpations: Abdomen is soft.     Tenderness: There is no abdominal tenderness.  Musculoskeletal:        General: Normal range of motion.     Cervical back: Normal range of motion and neck supple.  Skin:    General: Skin is warm and  dry.  Neurological:     General: No focal deficit present.     Mental Status: She is alert and oriented to  person, place, and time.  Psychiatric:        Mood and Affect: Mood normal.        Behavior: Behavior normal.      UC Treatments / Results  Labs (all labs ordered are listed, but only abnormal results are displayed) Labs Reviewed  POCT URINALYSIS DIP (MANUAL ENTRY) - Abnormal; Notable for the following components:      Result Value   pH, UA 8.5 (*)    All other components within normal limits  POCT URINE PREGNANCY - Normal    EKG   Radiology No results found.  Procedures Procedures (including critical care time)  Medications Ordered in UC Medications  ondansetron (ZOFRAN-ODT) disintegrating tablet 4 mg (4 mg Oral Given 12/13/23 0921)    Initial Impression / Assessment and Plan / UC Course  I have reviewed the triage vital signs and the nursing notes.  Pertinent labs & imaging results that were available during my care of the patient were reviewed by me and considered in my medical decision making (see chart for details).    33 yo female with history of hypertension presents with symptoms suggestive of acute gastroenteritis. She is afebrile and nontoxic.Unremarkable physical exam. BP controlled. Patient reports being nonadherent to prescribed antihypertensive thearpy. Advised patient to monitor BP closely and take meds as prescribed. Supportive care for symptom management discussed. ED precautions reviewed.  Final Clinical Impressions(s) / UC Diagnoses   Final diagnoses:  Acute gastroenteritis  Essential hypertension     Discharge Instructions      You were seen today for nausea, vomiting, diarrhea, and headache, likely caused by a norovirus infection. Norovirus affects the stomach and intestines and spreads easily, especially in places like daycare centers, where others have similar symptoms. There is no specific treatment for norovirus, but symptoms usually  improve in a few days.  The key to recovery is staying hydrated. Continue drinking fluids like fruit juice, Gatorade, and ginger ale, which you've tolerated well. You can gradually add bland foods such as bananas, rice, applesauce, and toast. Avoid spicy, greasy, fried foods, and dairy.  Make sure to wash your hands often and disinfect frequently touched surfaces like counters, doorknobs, and faucets using a bleach-based cleaner. Wash your bed linens in hot water to reduce the risk of spreading the virus.     ED Prescriptions     Medication Sig Dispense Auth. Provider   ondansetron (ZOFRAN-ODT) 8 MG disintegrating tablet Take 1 tablet (8 mg total) by mouth every 8 (eight) hours as needed for nausea or vomiting. 12 tablet Lurline Idol, FNP      PDMP not reviewed this encounter.   Lurline Idol, [place] 12/18/23 [phone]

## 2023-12-13 NOTE — ED Triage Notes (Signed)
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 No fever. No new/unexplained rash. No sore throat. No runny nose/cough. Last emesis "yesterday evening".

## 2023-12-13 NOTE — Patient Instructions (Signed)
 Please go be seen in person at your PCP office and or local Urgent Care.

## 2023-12-13 NOTE — Progress Notes (Signed)
 Virtual Visit Consent   Becky Porter, you are scheduled for a virtual visit with a [facility] provider today. Just as with appointments in the office, your consent must be obtained to participate. Your consent will be active for this visit and any virtual visit you may have with one of our providers in the next 365 days. If you have a MyChart account, a copy of this consent can be sent to you electronically.  As this is a virtual visit, video technology does not allow for your provider to perform a traditional examination. This may limit your provider's ability to fully assess your condition. If your provider identifies any concerns that need to be evaluated in person or the need to arrange testing (such as labs, EKG, etc.), we will make arrangements to do so. Although advances in technology are sophisticated, we cannot ensure that it will always work on either your end or our end. If the connection with a video visit is poor, the visit may have to be switched to a telephone visit. With either a video or telephone visit, we are not always able to ensure that we have a secure connection.  By engaging in this virtual visit, you consent to the provision of healthcare and authorize for your insurance to be billed (if applicable) for the services provided during this visit. Depending on your insurance coverage, you may receive a charge related to this service.  I need to obtain your verbal consent now. Are you willing to proceed with your visit today? Becky Porter has provided verbal consent on 12/13/2023 for a virtual visit (video or telephone). Freddy Finner, NP  Date: 12/13/2023 8:17 AM   Virtual Visit via Video Note   I, Freddy Finner, connected with  Becky Porter  ([id number], 1991/03/13) on 12/13/23 at  8:15 AM EDT by a video-enabled telemedicine application and verified that I am speaking with the correct person using two identifiers.  Location: Patient: Virtual Visit Location Patient:  Home Provider: Virtual Visit Location Provider: Home Office   I discussed the limitations of evaluation and management by telemedicine and the availability of in person appointments. The patient expressed understanding and agreed to proceed.    History of Present Illness: Becky Porter is a 33 y.o. who identifies as a female who was assigned female at birth, and is being seen today for gi upset  Vomiting, diarrhea, headache Onset over a week ago- still having diarrhea post use of OTC. Not getting better. Denies blood in stool   Problems:  Patient Active Problem List   Diagnosis Date Noted   IUD (intrauterine device) in place 06/16/2023   Alpha thalassemia silent carrier 11/09/2022   Benign essential hypertension 10/22/2022   BMI 60.0-69.9, adult (HCC) 10/22/2022    Allergies: No Known Allergies Medications:  Current Outpatient Medications:    acetaminophen (TYLENOL) 325 MG tablet, Take 2 tablets (650 mg total) by mouth every 4 (four) hours as needed (for pain scale < 4)., Disp: 100 tablet, Rfl: 0   hydrochlorothiazide (MICROZIDE) 12.5 MG capsule, Take 1 capsule (12.5 mg total) by mouth daily., Disp: 90 capsule, Rfl: 3   ibuprofen (ADVIL) 600 MG tablet, Take 1 tablet (600 mg total) by mouth every 6 (six) hours., Disp: 30 tablet, Rfl: 0   metoCLOPramide (REGLAN) 10 MG tablet, Take 1 tablet (10 mg total) by mouth 3 (three) times daily before meals. Take 3 times a day for 30 days,Then take 2 a day for 4 days, Then take 1 a  day for 4 days, Then stop, Disp: 102 tablet, Rfl: 0   ondansetron (ZOFRAN) 4 MG tablet, Take 1 tablet (4 mg total) by mouth every 8 (eight) hours as needed for nausea or vomiting., Disp: 20 tablet, Rfl: 0   oseltamivir (TAMIFLU) 75 MG capsule, Take 1 capsule (75 mg total) by mouth 2 (two) times daily., Disp: 10 capsule, Rfl: 0   prenatal vitamin w/FE, FA (PRENATAL 1 + 1) 27-1 MG TABS tablet, Take 1 tablet by mouth daily at 12 noon., Disp: , Rfl:    Observations/Objective: Patient is well-developed, well-nourished in no acute distress.  Resting comfortably  at home.  Head is normocephalic, atraumatic.  No labored breathing.  Speech is clear and coherent with logical content.  Patient is alert and oriented at baseline.    Assessment and Plan:  1. Diarrhea, unspecified type (Primary)  Diarrhea over a week with OTC use - needs in person assessment  Patient acknowledged agreement and understanding of the plan.     Follow Up Instructions: I discussed the assessment and treatment plan with the patient. The patient was provided an opportunity to ask questions and all were answered. The patient agreed with the plan and demonstrated an understanding of the instructions.  A copy of instructions were sent to the patient via MyChart unless otherwise noted below.    The patient was advised to call back or seek an in-person evaluation if the symptoms worsen or if the condition fails to improve as anticipated.    Freddy Finner, NP

## 2023-12-15 ENCOUNTER — Encounter: Payer: Self-pay | Admitting: Pharmacist

## 2023-12-15 ENCOUNTER — Telehealth: Payer: Self-pay | Admitting: Pharmacy Technician

## 2023-12-15 ENCOUNTER — Other Ambulatory Visit (HOSPITAL_COMMUNITY): Payer: Self-pay

## 2023-12-15 ENCOUNTER — Ambulatory Visit: Payer: 59 | Attending: Cardiology | Admitting: Pharmacist

## 2023-12-15 VITALS — BP 122/82 | HR 78

## 2023-12-15 DIAGNOSIS — I10 Essential (primary) hypertension: Secondary | ICD-10-CM

## 2023-12-15 NOTE — Patient Instructions (Signed)
 It was nice meeting you today  Your blood pressure is improved and your refill is waiting for you at [facility]  We would like to keep it less than 130/80  Please continue your hydrochlorothiazide 12.5mg  once a day  Respond to the myChart message with any questions  Laural Golden, PharmD, BCACP, CDCES, CPP [address], [address] [place], [place], [address] Phone: [phone], Fax: [phone]

## 2023-12-15 NOTE — Progress Notes (Signed)
 Patient ID: Becky Porter                 DOB: 1991-07-27                      MRN: [id number]     HPI: Becky Porter is a 33 y.o. female referred by Dr. Servando Salina to HTN clinic. PMH is significant for HTN and obesity. Nifedipine switched to hydrochlorothiazide at last visit as patient is no longer breastfeeding.  Patient presents today for HTN f/u. Has been out of hydrochlorothiazide for about 1 week. Tried to refill at pharmacy was told it needed approval from prescriber. Took nifedipine for a couple days.  [name]) keeps her up often during the night. Has not been sleeping well.  Current HTN meds:  Hydrochlorothiazide 12.5mg  daily  Wt Readings from Last 3 Encounters:  12/13/23 (!) 370 lb (167.8 kg)  11/02/23 (!) 369 lb 12.8 oz (167.7 kg)  08/09/23 (!) 367 lb 12.8 oz (166.8 kg)   BP Readings from Last 3 Encounters:  12/13/23 125/85  11/02/23 (!) 138/100  08/09/23 132/70   Pulse Readings from Last 3 Encounters:  12/13/23 83  11/02/23 78  08/09/23 74    Renal function: CrCl cannot be calculated (Patient's most recent lab result is older than the maximum 21 days allowed.).  Past Medical History:  Diagnosis Date   Hypertension    Pregnancy induced hypertension     Current Outpatient Medications on File Prior to Visit  Medication Sig Dispense Refill   hydrochlorothiazide (MICROZIDE) 12.5 MG capsule Take 1 capsule (12.5 mg total) by mouth daily. 90 capsule 3   ondansetron (ZOFRAN-ODT) 8 MG disintegrating tablet Take 1 tablet (8 mg total) by mouth every 8 (eight) hours as needed for nausea or vomiting. 12 tablet 0   No current facility-administered medications on file prior to visit.    No Known Allergies   Assessment/Plan:  1. Hypertension -  Patient BP in room 122/82 checked manually using thigh cuff. Contacted her pharmacy who said they can fill her hydrochlorothiazide today. Patient will have her mother pick up for her. Set up patient's mychart so she can send  messages or questions. No med changes needed at this time. Follow up with Dr Servando Salina in 2 months.  Continue hydrochlorothiazide 12.5mg  daily F/u with Dr Servando Salina in 2 months  Laural Golden, PharmD, BCACP, CDCES, CPP [address], [address] [place], [place], [address] Phone: [phone], Fax: [phone]

## 2023-12-15 NOTE — Telephone Encounter (Signed)
 Ran test claim for hctz. For a 30 day supply and the co-pay is 0.00 . PA is not needed at this time. Nothing saying this is a transition fill. This test claim was processed through [facility]- copay amounts may vary at other pharmacies due to pharmacy/plan contracts, or as the patient moves through the different stages of their insurance plan.      [facility] and asked [facility] to [facility] the [facility] and it went through for them

## 2024-01-30 ENCOUNTER — Telehealth: Admitting: Physician Assistant

## 2024-01-30 ENCOUNTER — Telehealth: Admitting: Family Medicine

## 2024-01-30 DIAGNOSIS — J069 Acute upper respiratory infection, unspecified: Secondary | ICD-10-CM

## 2024-01-30 DIAGNOSIS — J208 Acute bronchitis due to other specified organisms: Secondary | ICD-10-CM

## 2024-01-30 MED ORDER — BENZONATATE 200 MG PO CAPS: 200.0000 mg | ORAL_CAPSULE | Freq: Two times a day (BID) | ORAL | 0 refills | Status: DC | PRN

## 2024-01-30 MED ORDER — PREDNISONE 20 MG PO TABS: 20.0000 mg | ORAL_TABLET | Freq: Two times a day (BID) | ORAL | 0 refills | Status: AC

## 2024-01-30 MED ORDER — PROMETHAZINE-DM 6.25-15 MG/5ML PO SYRP: 5.0000 mL | ORAL_SOLUTION | Freq: Four times a day (QID) | ORAL | 0 refills | Status: DC | PRN

## 2024-01-30 NOTE — Progress Notes (Addendum)
 Virtual Visit Consent   Becky Porter, you are scheduled for a virtual visit with a [facility] provider today. Just as with appointments in the office, your consent must be obtained to participate. Your consent will be active for this visit and any virtual visit you may have with one of our providers in the next 365 days. If you have a MyChart account, a copy of this consent can be sent to you electronically.  As this is a virtual visit, video technology does not allow for your provider to perform a traditional examination. This may limit your provider's ability to fully assess your condition. If your provider identifies any concerns that need to be evaluated in person or the need to arrange testing (such as labs, EKG, etc.), we will make arrangements to do so. Although advances in technology are sophisticated, we cannot ensure that it will always work on either your end or our end. If the connection with a video visit is poor, the visit may have to be switched to a telephone visit. With either a video or telephone visit, we are not always able to ensure that we have a secure connection.  By engaging in this virtual visit, you consent to the provision of healthcare and authorize for your insurance to be billed (if applicable) for the services provided during this visit. Depending on your insurance coverage, you may receive a charge related to this service.  I need to obtain your verbal consent now. Are you willing to proceed with your visit today? Becky Porter has provided verbal consent on 01/30/2024 for a virtual visit (video or telephone). Char Common Ward, PA-C  Date: 01/30/2024 7:20 PM   Virtual Visit via Video Note   I, Char Common Ward, connected with  Becky Porter  ([id number], 1991-09-18) on 01/30/24 at  7:15 PM EDT by a video-enabled telemedicine application and verified that I am speaking with the correct person using two identifiers.  Location: Patient: Virtual Visit Location Patient:  Home Provider: Virtual Visit Location Provider: Home Office   I discussed the limitations of evaluation and management by telemedicine and the availability of in person appointments. The patient expressed understanding and agreed to proceed.    History of Present Illness: Becky Porter is a 33 y.o. who identifies as a female who was assigned female at birth, and is being seen today for cough and congestion that started last night.  Pt also complains of a headache. Pt reports missed work today due to headache.  Has a h/o migraines and reports today's headache is similar to past.  Denies shortness of breath and wheezing.  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 She has taken ibuprofen  today.   HPI: HPI  Problems:  Patient Active Problem List   Diagnosis Date Noted   IUD (intrauterine device) in place 06/16/2023   Alpha thalassemia silent carrier 11/09/2022   Benign essential hypertension 10/22/2022   BMI 60.0-69.9, adult (HCC) 10/22/2022   Encounter for IUD insertion 08/03/2020   Increased frequency of urination 03/18/2020   Encounter for Papanicolaou smear for cervical cancer screening 03/18/2020   Amenorrhea 12/17/2019   Chronic migraine without aura without status migrainosus, not intractable 11/01/2019   Current moderate episode of major depressive disorder without prior episode (HCC) 11/01/2019   Elevated LFTs 11/01/2019   Positive depression screening 11/01/2019   Encounter for other general counseling and advice on contraception 11/01/2019   Chronic daily headache 07/02/2019   Need for immunization against influenza 07/02/2019   Morbid  obesity with BMI of 60.0-69.9, adult (HCC) 07/02/2019   Encounter for hepatitis C screening test for low risk patient 07/02/2019   Dietary counseling 07/02/2019   Screening for diabetes mellitus 07/02/2019   Exercise counseling 07/02/2019   Encounter for screening for HIV 07/02/2019   Seasonal allergic rhinitis due to pollen 07/02/2019     Allergies: No Known Allergies Medications:  Current Outpatient Medications:    promethazine-dextromethorphan (PROMETHAZINE-DM) 6.25-15 MG/5ML syrup, Take 5 mLs by mouth 4 (four) times daily as needed for cough., Disp: 118 mL, Rfl: 0   benzonatate (TESSALON) 200 MG capsule, Take 1 capsule (200 mg total) by mouth 2 (two) times daily as needed for cough., Disp: 20 capsule, Rfl: 0   hydrochlorothiazide  (MICROZIDE ) 12.5 MG capsule, Take 1 capsule (12.5 mg total) by mouth daily., Disp: 90 capsule, Rfl: 3   ondansetron  (ZOFRAN -ODT) 8 MG disintegrating tablet, Take 1 tablet (8 mg total) by mouth every 8 (eight) hours as needed for nausea or vomiting., Disp: 12 tablet, Rfl: 0   predniSONE  (DELTASONE ) 20 MG tablet, Take 1 tablet (20 mg total) by mouth 2 (two) times daily with a meal for 5 days., Disp: 10 tablet, Rfl: 0  Observations/Objective: Patient is well-developed, well-nourished in no acute distress.  Resting comfortably at home.  Head is normocephalic, atraumatic.  No labored breathing.  Speech is clear and coherent with logical content.  Patient is alert and oriented at baseline.    Assessment and Plan: 1. Viral upper respiratory tract infection (Primary)  Supportive care discussed.  In person evaluation precautions discussed.   Addendum: pt did a evisit earlier today with tessalon and prednisone  prescribed.  Ok to take these medications.   Follow Up Instructions: I discussed the assessment and treatment plan with the patient. The patient was provided an opportunity to ask questions and all were answered. The patient agreed with the plan and demonstrated an understanding of the instructions.  A copy of instructions were sent to the patient via MyChart unless otherwise noted below.     The patient was advised to call back or seek an in-person evaluation if the symptoms worsen or if the condition fails to improve as anticipated.    Char Common Ward, PA-C

## 2024-01-30 NOTE — Patient Instructions (Addendum)
  Becky Porter, thank you for joining Char Common Ward, PA-C for today's virtual visit.  While this provider is not your primary care provider (PCP), if your PCP is located in our provider database this encounter information will be shared with them immediately following your visit.   A [facility] MyChart account gives you access to today's visit and all your visits, tests, and labs performed at [facility] " click here if you don't have a [facility] MyChart account or go to [email or web address]  Consent: (Patient) Becky Porter provided verbal consent for this virtual visit at the beginning of the encounter.  Current Medications:  Current Outpatient Medications:    promethazine-dextromethorphan (PROMETHAZINE-DM) 6.25-15 MG/5ML syrup, Take 5 mLs by mouth 4 (four) times daily as needed for cough., Disp: 118 mL, Rfl: 0   benzonatate (TESSALON) 200 MG capsule, Take 1 capsule (200 mg total) by mouth 2 (two) times daily as needed for cough., Disp: 20 capsule, Rfl: 0   hydrochlorothiazide  (MICROZIDE ) 12.5 MG capsule, Take 1 capsule (12.5 mg total) by mouth daily., Disp: 90 capsule, Rfl: 3   ondansetron  (ZOFRAN -ODT) 8 MG disintegrating tablet, Take 1 tablet (8 mg total) by mouth every 8 (eight) hours as needed for nausea or vomiting., Disp: 12 tablet, Rfl: 0   predniSONE  (DELTASONE ) 20 MG tablet, Take 1 tablet (20 mg total) by mouth 2 (two) times daily with a meal for 5 days., Disp: 10 tablet, Rfl: 0   Medications ordered in this encounter:  Meds ordered this encounter  Medications   promethazine-dextromethorphan (PROMETHAZINE-DM) 6.25-15 MG/5ML syrup    Sig: Take 5 mLs by mouth 4 (four) times daily as needed for cough.    Dispense:  118 mL    Refill:  0    Supervising Provider:   Corine Dice [[id number]]     *If you need refills on other medications prior to your next appointment, please contact your pharmacy*  Follow-Up: Call back or seek an in-person evaluation if  the symptoms worsen or if the condition fails to improve as anticipated.  [facility] [phone]  Other Instructions Can take cough syrup as prescribed.  If there is trouble with this medication being covered by your insurance recommend Robitussin cough syrup which is over the counter.  Also recommend Mucinex and Flonase .  Can alternate Ibuprofen  and Tylenol  for headache.  Drink plenty of fluids, rest.     If you have been instructed to have an in-person evaluation today at a local Urgent Care facility, please use the link below. It will take you to a list of all of our available [facility], including address, phone number and hours of operation. Please do not delay care.  [facility]  If you or a family member do not have a primary care provider, use the link below to schedule a visit and establish care. When you choose a [facility] primary care physician or advanced practice provider, you gain a long-term partner in health. Find a Primary Care Provider  Learn more about [facility]'s in-office and virtual care options: [facility] - Get Care Now

## 2024-01-30 NOTE — Progress Notes (Signed)
 E-Visit for Cough   We are sorry that you are not feeling well.  Here is how we plan to help!  Based on your presentation I believe you most likely have A cough due to a virus.  This is called viral bronchitis and is best treated by rest, plenty of fluids and control of the cough.  You may use Ibuprofen  or Tylenol  as directed to help your symptoms.     In addition you may use A prescription cough medication called Tessalon Perles 100mg . You may take 1-2 capsules every 8 hours as needed for your cough.  Prednisone  will be sent also.   From your responses in the eVisit questionnaire you describe inflammation in the upper respiratory tract which is causing a significant cough.  This is commonly called Bronchitis and has four common causes:   Allergies Viral Infections Acid Reflux Bacterial Infection Allergies, viruses and acid reflux are treated by controlling symptoms or eliminating the cause. An example might be a cough caused by taking certain blood pressure medications. You stop the cough by changing the medication. Another example might be a cough caused by acid reflux. Controlling the reflux helps control the cough.  USE OF BRONCHODILATOR ("RESCUE") INHALERS: There is a risk from using your bronchodilator too frequently.  The risk is that over-reliance on a medication which only relaxes the muscles surrounding the breathing tubes can reduce the effectiveness of medications prescribed to reduce swelling and congestion of the tubes themselves.  Although you feel brief relief from the bronchodilator inhaler, your asthma may actually be worsening with the tubes becoming more swollen and filled with mucus.  This can delay other crucial treatments, such as oral steroid medications. If you need to use a bronchodilator inhaler daily, several times per day, you should discuss this with your provider.  There are probably better treatments that could be used to keep your asthma under control.     HOME  CARE Only take medications as instructed by your medical team. Complete the entire course of an antibiotic. Drink plenty of fluids and get plenty of rest. Avoid close contacts especially the very young and the elderly Cover your mouth if you cough or cough into your sleeve. Always remember to wash your hands A steam or ultrasonic humidifier can help congestion.   GET HELP RIGHT AWAY IF: You develop worsening fever. You become short of breath You cough up blood. Your symptoms persist after you have completed your treatment plan MAKE SURE YOU  Understand these instructions. Will watch your condition. Will get help right away if you are not doing well or get worse.    Thank you for choosing an e-visit.  Your e-visit answers were reviewed by a board certified advanced clinical practitioner to complete your personal care plan. Depending upon the condition, your plan could have included both over the counter or prescription medications.  Please review your pharmacy choice. Make sure the pharmacy is open so you can pick up prescription now. If there is a problem, you may contact your provider through [employer] and have the prescription routed to another pharmacy.  Your safety is important to us . If you have drug allergies check your prescription carefully.   For the next 24 hours you can use MyChart to ask questions about today's visit, request a non-urgent call back, or ask for a work or school excuse. You will get an email in the next two days asking about your experience. I hope that your e-visit has been  valuable and will speed your recovery.  have provided 5 minutes of non face to face time during this encounter for chart review and documentation.

## 2024-02-21 ENCOUNTER — Encounter: Payer: Self-pay | Admitting: Cardiology

## 2024-02-21 ENCOUNTER — Ambulatory Visit: Payer: 59 | Attending: Cardiology | Admitting: Cardiology

## 2024-02-21 VITALS — BP 135/82 | HR 94 | Ht 64.0 in | Wt 372.2 lb

## 2024-02-21 DIAGNOSIS — I10 Essential (primary) hypertension: Secondary | ICD-10-CM | POA: Insufficient documentation

## 2024-02-21 DIAGNOSIS — Z79899 Other long term (current) drug therapy: Secondary | ICD-10-CM | POA: Diagnosis not present

## 2024-02-21 MED ORDER — HYDROCHLOROTHIAZIDE 25 MG PO TABS: 25.0000 mg | ORAL_TABLET | Freq: Every day | ORAL | 3 refills | Status: AC

## 2024-02-21 NOTE — Progress Notes (Signed)
 Cardio-Obstetrics Clinic  New Evaluation  Date:  02/21/2024   ID:  Becky Porter, DOB 06-02-1991, MRN [id number]  PCP:  Becky Porter   [facility] Providers Cardiologist:  Becky Morin, DO  Electrophysiologist:  None       Referring MD: No ref. provider found   Chief Complaint: I am doing okay  History of Present Illness:    Becky Porter is a 33 y.o. female [G1P1001] who is being seen today in follow up.   Medical history include chronic hypertension, morbid obesity here today to be evaluated for elevated blood pressure. She is doing well, back to work. She is here with her daughter.  At her last visit I started the patient on hydrochlorothiazide  25 mg daily.  In we had been gradually titrated off her nifedipine .  She also no complaints today.  Today is her birthday  Prior CV Studies Reviewed: The following studies were reviewed today: None available  Past Medical History:  Diagnosis Date   Hypertension    Pregnancy induced hypertension     No past surgical history on file.    OB History     Gravida  1   Para  1   Term  1   Preterm      AB      Living  1      SAB      IAB      Ectopic      Multiple  0   Live Births  1               Current Medications: No outpatient medications have been marked as taking for the 02/21/24 encounter (Office Visit) with Becky Novoa, DO.     Allergies:   Patient has no known allergies.   Social History   Socioeconomic History   Marital status: Significant Other    Spouse name: Not on file   Number of children: Not on file   Years of education: Not on file   Highest education level: Some college, no degree  Occupational History   Not on file  Tobacco Use   Smoking status: Never   Smokeless tobacco: Never  Vaping Use   Vaping status: Never Used  Substance and Sexual Activity   Alcohol use: Not Currently    Comment: social   Drug use: No   Sexual activity: Not Currently    Birth  control/protection: None  Other Topics Concern   Not on file  Social History Narrative   Not on file   Social Drivers of Health   Financial Resource Strain: Medium Risk (01/23/2023)   Overall Financial Resource Strain (CARDIA)    Difficulty of Paying Living Expenses: Somewhat hard  Food Insecurity: Food Insecurity Present (01/23/2023)   Hunger Vital Sign    Worried About Running Out of Food in the Last Year: Sometimes true    Ran Out of Food in the Last Year: Sometimes true  Transportation Needs: No Transportation Needs (01/23/2023)   PRAPARE - Administrator, Civil Service (Medical): No    Lack of Transportation (Non-Medical): No  Recent Concern: Transportation Needs - Unmet Transportation Needs (01/23/2023)   PRAPARE - Transportation    Lack of Transportation (Medical): No    Lack of Transportation (Non-Medical): Yes  Physical Activity: Insufficiently Active (01/23/2023)   Exercise Vital Sign    Days of Exercise per Week: 3 days    Minutes of Exercise per Session: 10 min  Stress: Stress Concern Present (  01/23/2023)   [employer] [employer] - Occupational Stress Questionnaire    Feeling of Stress : Rather much  Social Connections: Moderately Isolated (01/23/2023)   Social Connection and Isolation Panel [NHANES]    Frequency of Communication with Friends and Family: More than three times a week    Frequency of Social Gatherings with Friends and Family: Twice a week    Attends Religious Services: 1 to 4 times per year    Active Member of [employer] or Organizations: No    Attends Engineer, structural: Not on file    Marital Status: Never married      Family History  Problem Relation Age of Onset   Kidney disease Father    Diabetes Maternal Grandmother    Stroke Maternal Grandmother    Stroke Paternal Grandmother    Cancer Paternal Grandfather    Asthma Neg Hx       ROS:   Please see the history of present illness.     All other systems  reviewed and are negative.   Labs/EKG Reviewed:    EKG:   EKG is was not ordered today.    Recent Labs: 04/15/2023: Hemoglobin 11.5; Platelets 282 11/02/2023: ALT 14; BUN 12; Creatinine, Ser 0.63; Magnesium  1.9; Potassium 4.3; Sodium 138   Recent Lipid Panel No results found for: "CHOL", "TRIG", "HDL", "CHOLHDL", "LDLCALC", "LDLDIRECT"  Physical Exam:    VS:  BP 135/82 (BP Location: Left Arm, Patient Position: Sitting, Cuff Size: Large)   Pulse 94   Ht 5\' 4"  (1.626 m)   Wt (!) 372 lb 3.2 oz (168.8 kg)   SpO2 98%   BMI 63.89 kg/m     Wt Readings from Last 3 Encounters:  02/21/24 (!) 372 lb 3.2 oz (168.8 kg)  12/13/23 (!) 370 lb (167.8 kg)  11/02/23 (!) 369 lb 12.8 oz (167.7 kg)     GEN:  Well nourished, well developed in no acute distress HEENT: Normal NECK: No JVD; No carotid bruits LYMPHATICS: No lymphadenopathy CARDIAC: RRR, no murmurs, rubs, gallops RESPIRATORY:  Clear to auscultation without rales, wheezing or rhonchi  ABDOMEN: Soft, non-tender, non-distended MUSCULOSKELETAL:  No edema; No deformity  SKIN: Warm and dry NEUROLOGIC:  Alert and oriented x 3 PSYCHIATRIC:  Normal affect    Risk Assessment/Risk Calculators:                  ASSESSMENT & PLAN:    Chronic hypertension  Morbid obesity  Her blood pressure is elevated in the office not yet below 135/85 mmHg, we will increase her hydrochlorothiazide  to 25 mg daily.   The patient understands the need to lose weight with diet and exercise. We have discussed specific strategies for this.  The patient understands the need to lose weight with diet and exercise. We have discussed specific strategies for this.    There are no Patient Instructions on file for this visit.   Dispo:  No follow-ups on file.   Medication Adjustments/Labs and Tests Ordered: Current medicines are reviewed at length with the patient today.  Concerns regarding medicines are outlined above.  Tests Ordered: No orders of  the defined types were placed in this encounter.  Medication Changes: No orders of the defined types were placed in this encounter.

## 2024-02-21 NOTE — Patient Instructions (Signed)
 Medication Instructions:   INCREASE hydrochlorothiazide  TO 25 MG ONCE DAILY=2 OF THE 12.5 MG TABLETS ONCE DAILY  *If you need a refill on your cardiac medications before your next appointment, please call your pharmacy*  Lab Work:  Your physician recommends that you return for lab work in: ONE WEEK-DO NOT NEED TO FAST  If you have labs (blood work) drawn today and your tests are completely normal, you will receive your results only by: MyChart Message (if you have MyChart) OR A paper copy in the mail If you have any lab test that is abnormal or we need to change your treatment, we will call you to review the results.    Follow-Up: At [facility], you and your health needs are our priority.  As part of our continuing mission to provide you with exceptional heart care, our providers are all part of one team.  This team includes your primary Cardiologist (physician) and Advanced Practice Providers or APPs (Physician Assistants and Nurse Practitioners) who all work together to provide you with the care you need, when you need it.  Your next appointment:   12 month(s)  Provider:   Kardie Tobb, DO    We recommend signing up for the patient portal called "MyChart".  Sign up information is provided on this After Visit Summary.  MyChart is used to connect with patients for Virtual Visits (Telemedicine).  Patients are able to view lab/test results, encounter notes, upcoming appointments, etc.  Non-urgent messages can be sent to your provider as well.   To learn more about what you can do with MyChart, go to [email or web address].

## 2024-02-26 ENCOUNTER — Telehealth: Admitting: Physician Assistant

## 2024-02-26 ENCOUNTER — Telehealth

## 2024-05-29 ENCOUNTER — Telehealth: Admitting: Physician Assistant

## 2024-05-29 DIAGNOSIS — A084 Viral intestinal infection, unspecified: Secondary | ICD-10-CM

## 2024-05-29 MED ORDER — ONDANSETRON 4 MG PO TBDP: 4.0000 mg | ORAL_TABLET | Freq: Three times a day (TID) | ORAL | 0 refills | Status: DC | PRN

## 2024-05-29 NOTE — Progress Notes (Signed)
 Virtual Visit Consent   Becky Porter, you are scheduled for a virtual visit with a [facility] provider today. Just as with appointments in the office, your consent must be obtained to participate. Your consent will be active for this visit and any virtual visit you may have with one of our providers in the next 365 days. If you have a MyChart account, a copy of this consent can be sent to you electronically.  As this is a virtual visit, video technology does not allow for your provider to perform a traditional examination. This may limit your provider's ability to fully assess your condition. If your provider identifies any concerns that need to be evaluated in person or the need to arrange testing (such as labs, EKG, etc.), we will make arrangements to do so. Although advances in technology are sophisticated, we cannot ensure that it will always work on either your end or our end. If the connection with a video visit is poor, the visit may have to be switched to a telephone visit. With either a video or telephone visit, we are not always able to ensure that we have a secure connection.  By engaging in this virtual visit, you consent to the provision of healthcare and authorize for your insurance to be billed (if applicable) for the services provided during this visit. Depending on your insurance coverage, you may receive a charge related to this service.  I need to obtain your verbal consent now. Are you willing to proceed with your visit today? [name] has provided verbal consent on 05/29/2024 for a virtual visit (video or telephone). Becky Porter, [place]  Date: 05/29/2024 10:38 AM   Virtual Visit via Video Note   I, Becky Porter, connected with  Lourdez Mcgahan  ([id number], 1991-06-12) on 05/29/24 at 10:30 AM EDT by a video-enabled telemedicine application and verified that I am speaking with the correct person using two identifiers.  Location: Patient: Virtual Visit  Location Patient: Home Provider: Virtual Visit Location Provider: Home Office   I discussed the limitations of evaluation and management by telemedicine and the availability of in person appointments. The patient expressed understanding and agreed to proceed.    History of Present Illness: Becky Porter is a 33 y.o. who identifies as a female who was assigned female at birth, and is being seen today for fatigue, mild aching and an episode of vomiting this morning. Some residual nausea.  Denies fever or chills. Denies abdominal pain or diarrhea so far, just the fatigue and nausea. 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   HPI: HPI  Problems:  Patient Active Problem List   Diagnosis Date Noted   IUD (intrauterine device) in place 06/16/2023   Alpha thalassemia silent carrier 11/09/2022   Benign essential hypertension 10/22/2022   BMI 60.0-69.9, adult (HCC) 10/22/2022   Encounter for IUD insertion 08/03/2020   Increased frequency of urination 03/18/2020   Encounter for Papanicolaou smear for cervical cancer screening 03/18/2020   Amenorrhea 12/17/2019   Chronic migraine without aura without status migrainosus, not intractable 11/01/2019   Current moderate episode of major depressive disorder without prior episode (HCC) 11/01/2019   Elevated LFTs 11/01/2019   Positive depression screening 11/01/2019   Encounter for other general counseling and advice on contraception 11/01/2019   Chronic daily headache 07/02/2019   Need for immunization against influenza 07/02/2019   Morbid obesity with BMI of 60.0-69.9, adult (HCC) 07/02/2019   Encounter for  hepatitis C screening test for low risk patient 07/02/2019   Dietary counseling 07/02/2019   Screening for diabetes mellitus 07/02/2019   Exercise counseling 07/02/2019   Encounter for screening for HIV 07/02/2019   Seasonal allergic rhinitis due to pollen 07/02/2019    Allergies: No Known  Allergies Medications:  Current Outpatient Medications:    ondansetron  (ZOFRAN -ODT) 4 MG disintegrating tablet, Take 1 tablet (4 mg total) by mouth every 8 (eight) hours as needed for nausea or vomiting., Disp: 20 tablet, Rfl: 0   hydrochlorothiazide  (HYDRODIURIL ) 25 MG tablet, Take 1 tablet (25 mg total) by mouth daily., Disp: 90 tablet, Rfl: 3  Observations/Objective: Patient is well-developed, well-nourished in no acute distress.  Resting comfortably at home.  Head is normocephalic, atraumatic.  No labored breathing. Speech is clear and coherent with logical content.  Patient is alert and oriented at baseline.   Assessment and Plan: 1. Viral gastroenteritis (Primary) - ondansetron  (ZOFRAN -ODT) 4 MG disintegrating tablet; Take 1 tablet (4 mg total) by mouth every 8 (eight) hours as needed for nausea or vomiting.  Dispense: 20 tablet; Refill: 0  Suspected start of viral gastroenteritis. Supportive measures and OTC medications reviewed. Zofran  per orders.  Start [employer] -- handout put in AVS on MyChart. Work note provided. Strict in-person evaluation precautions reviewed.  Follow Up Instructions: I discussed the assessment and treatment plan with the patient. The patient was provided an opportunity to ask questions and all were answered. The patient agreed with the plan and demonstrated an understanding of the instructions.  A copy of instructions were sent to the patient via MyChart unless otherwise noted below.   The patient was advised to call back or seek an in-person evaluation if the symptoms worsen or if the condition fails to improve as anticipated.    Becky Velma Lunger, PA-C

## 2024-05-29 NOTE — Patient Instructions (Addendum)
 Becky Porter, thank you for joining [name], PA-C for today's virtual visit.  While this provider is not your primary care provider (PCP), if your PCP is located in our provider database this encounter information will be shared with them immediately following your visit.   A [facility] MyChart account gives you access to today's visit and all your visits, tests, and labs performed at [facility]  click here if you don't have a [facility] MyChart account or go to [email or web address]  Consent: (Patient) Becky Porter provided verbal consent for this virtual visit at the beginning of the encounter.  Current Medications:  Current Outpatient Medications:    benzonatate  (TESSALON ) 200 MG capsule, Take 1 capsule (200 mg total) by mouth 2 (two) times daily as needed for cough. (Patient not taking: Reported on 02/21/2024), Disp: 20 capsule, Rfl: 0   hydrochlorothiazide  (HYDRODIURIL ) 25 MG tablet, Take 1 tablet (25 mg total) by mouth daily., Disp: 90 tablet, Rfl: 3   ondansetron  (ZOFRAN -ODT) 8 MG disintegrating tablet, Take 1 tablet (8 mg total) by mouth every 8 (eight) hours as needed for nausea or vomiting. (Patient not taking: Reported on 02/21/2024), Disp: 12 tablet, Rfl: 0   promethazine -dextromethorphan (PROMETHAZINE -DM) 6.25-15 MG/5ML syrup, Take 5 mLs by mouth 4 (four) times daily as needed for cough. (Patient not taking: Reported on 02/21/2024), Disp: 118 mL, Rfl: 0   Medications ordered in this encounter:  No orders of the defined types were placed in this encounter.    *If you need refills on other medications prior to your next appointment, please contact your pharmacy*  Follow-Up: Call back or seek an in-person evaluation if the symptoms worsen or if the condition fails to improve as anticipated.  [facility] [phone]  Other Instructions Hydrate and rest. Please follow the dietary recommendations below for once appetite  returns. You can use the Zofran  as directed for nausea. If you note any non-resolving, new, or worsening symptoms despite treatment, please seek an in-person evaluation ASAP.  Food Choices to Help Relieve Diarrhea, Adult Diarrhea can make you feel weak and cause you to become dehydrated. Dehydration is a condition in which there is not enough water or other fluids in the body. It is important to choose the right foods and drinks to: Relieve diarrhea. Replace lost fluids and nutrients. Prevent dehydration. What are tips for following this plan? Relieving diarrhea Avoid foods that make your diarrhea worse. These may include: Foods and drinks that are sweetened with high-fructose corn syrup, honey, or sweeteners such as xylitol, sorbitol, and mannitol. Check food labels for these ingredients. Fried, greasy, or spicy foods. Raw fruits and vegetables. Eat foods that are rich in probiotics. These include foods such as yogurt and fermented milk products. Probiotics can help increase healthy bacteria in your stomach and intestines (gastrointestinal or GI tract). This may help digestion and stop diarrhea. If you have lactose intolerance, avoid dairy products. These may make your diarrhea worse. Take medicine to help stop diarrhea only as told by your health care provider. Replacing nutrients  Eat bland, easy-to-digest foods in small amounts as you are able, until your diarrhea starts to get better. These foods include bananas, applesauce, rice, toast, and crackers. Over time, add nutrient-rich foods as your body tolerates them or as told by your health care provider. These include: Well-cooked protein foods, such as eggs, lean meats like fish or chicken without skin, and tofu. Peeled, seeded, and soft-cooked fruits and vegetables. Low-fat dairy products. Whole grains.  Take vitamin and mineral supplements as told by your health care provider. Preventing dehydration  Start by sipping water or a  solution to prevent dehydration (oral rehydration solution, or ORS). This is a drink that helps replace fluids and minerals your body has lost. You can buy an ORS at pharmacies and retail stores. Try to drink at least 8-10 cups (2,000-2,500 mL) of fluid each day to help replace lost fluids. If your urine is pale yellow, you are getting enough fluids. You may drink other liquids in addition to water, such as fruit juice that you have added water to (diluted fruit juice) or low-calorie sports drinks, as tolerated or as told by your health care provider. Avoid drinks with caffeine , such as coffee, tea, or soft drinks. Avoid alcohol. This information is not intended to replace advice given to you by your health care provider. Make sure you discuss any questions you have with your health care provider. Document Revised: 03/01/2022 Document Reviewed: 03/01/2022 Elsevier Patient Education  2024 Elsevier Inc.   If you have been instructed to have an in-person evaluation today at a local Urgent Care facility, please use the link below. It will take you to a list of all of our available [facility], including address, phone number and hours of operation. Please do not delay care.  [facility]  If you or a family member do not have a primary care provider, use the link below to schedule a visit and establish care. When you choose a [facility] primary care physician or advanced practice provider, you gain a long-term partner in health. Find a Primary Care Provider  Learn more about [facility]'s in-office and virtual care options:  - Get Care Now

## 2024-06-18 ENCOUNTER — Telehealth: Admitting: Physician Assistant

## 2024-06-18 DIAGNOSIS — G43809 Other migraine, not intractable, without status migrainosus: Secondary | ICD-10-CM | POA: Diagnosis not present

## 2024-06-18 DIAGNOSIS — J069 Acute upper respiratory infection, unspecified: Secondary | ICD-10-CM | POA: Diagnosis not present

## 2024-06-18 MED ORDER — BENZONATATE 100 MG PO CAPS: 100.0000 mg | ORAL_CAPSULE | Freq: Three times a day (TID) | ORAL | 0 refills | Status: DC | PRN

## 2024-06-18 MED ORDER — PREDNISONE 10 MG (21) PO TBPK: ORAL_TABLET | ORAL | 0 refills | Status: DC

## 2024-06-18 MED ORDER — FLUTICASONE PROPIONATE 50 MCG/ACT NA SUSP: 2.0000 | Freq: Every day | NASAL | 0 refills | Status: DC

## 2024-06-18 NOTE — Patient Instructions (Signed)
  Becky Porter, thank you for joining [name], PA-C for today's virtual visit.  While this provider is not your primary care provider (PCP), if your PCP is located in our provider database this encounter information will be shared with them immediately following your visit.   A [place] MyChart account gives you access to today's visit and all your visits, tests, and labs performed at [facility]  click here if you don't have a [facility] MyChart account or go to [email or web address]  Consent: (Patient) Becky Porter provided verbal consent for this virtual visit at the beginning of the encounter.  Current Medications:  Current Outpatient Medications:    hydrochlorothiazide  (HYDRODIURIL ) 25 MG tablet, Take 1 tablet (25 mg total) by mouth daily., Disp: 90 tablet, Rfl: 3   ondansetron  (ZOFRAN -ODT) 4 MG disintegrating tablet, Take 1 tablet (4 mg total) by mouth every 8 (eight) hours as needed for nausea or vomiting., Disp: 20 tablet, Rfl: 0   Medications ordered in this encounter:  No orders of the defined types were placed in this encounter.    *If you need refills on other medications prior to your next appointment, please contact your pharmacy*  Follow-Up: Call back or seek an in-person evaluation if the symptoms worsen or if the condition fails to improve as anticipated.  [facility] [phone]  Other Instructions Please hydrate and rest. Take the prescribed medications as directed. Ok to start a saline nasal rinse over-the-counter and continue use of Tylenol  as needed.  If you note any non-resolving, new, or worsening symptoms despite treatment, please seek an in-person evaluation ASAP.    If you have been instructed to have an in-person evaluation today at a local Urgent Care facility, please use the link below. It will take you to a list of all of our available [facility], including address, phone number and  hours of operation. Please do not delay care.  [facility]  If you or a family member do not have a primary care provider, use the link below to schedule a visit and establish care. When you choose a [facility] primary care physician or advanced practice provider, you gain a long-term partner in health. Find a Primary Care Provider  Learn more about [facility]'s in-office and virtual care options: [place] - Get Care Now

## 2024-06-18 NOTE — Progress Notes (Signed)
 Virtual Visit Consent   Becky Porter, you are scheduled for a virtual visit with a [facility] provider today. Just as with appointments in the office, your consent must be obtained to participate. Your consent will be active for this visit and any virtual visit you may have with one of our providers in the next 365 days. If you have a MyChart account, a copy of this consent can be sent to you electronically.  As this is a virtual visit, video technology does not allow for your provider to perform a traditional examination. This may limit your provider's ability to fully assess your condition. If your provider identifies any concerns that need to be evaluated in person or the need to arrange testing (such as labs, EKG, etc.), we will make arrangements to do so. Although advances in technology are sophisticated, we cannot ensure that it will always work on either your end or our end. If the connection with a video visit is poor, the visit may have to be switched to a telephone visit. With either a video or telephone visit, we are not always able to ensure that we have a secure connection.  By engaging in this virtual visit, you consent to the provision of healthcare and authorize for your insurance to be billed (if applicable) for the services provided during this visit. Depending on your insurance coverage, you may receive a charge related to this service.  I need to obtain your verbal consent now. Are you willing to proceed with your visit today? Becky Porter has provided verbal consent on 06/18/2024 for a virtual visit (video or telephone). Becky Porter, [place]  Date: 06/18/2024 4:11 PM   Virtual Visit via Video Note   I, Becky Porter, connected with  Becky Porter  ([id number], [birth year]) on 06/18/24 at  4:00 PM EDT by a video-enabled telemedicine application and verified that I am speaking with the correct person using two identifiers.  Location: Patient: Virtual Visit  Location Patient: Home Provider: Virtual Visit Location Provider: Home Office   I discussed the limitations of evaluation and management by telemedicine and the availability of in person appointments. The patient expressed understanding and agreed to proceed.    History of Present Illness: Becky Porter is a 33 y.o. who identifies as a female who was assigned female at birth, and is being seen today for an acute migraine headache stemming from a mild nasal congestion and sinus pressure starting over the past few days. Notes sore throat and cough along with this. Denies fever, chills, sinus pain. Denies recent travel. Daughter with similar symptoms starting before hers. No other known sick contacts. Notes migraine headache yesterday and today with nausea, without emesis. Denies vision changes.   OTC -- Nothing.   HPI: HPI  Problems:  Patient Active Problem List   Diagnosis Date Noted   IUD (intrauterine device) in place 06/16/2023   Alpha thalassemia silent carrier 11/09/2022   Benign essential hypertension 10/22/2022   BMI 60.0-69.9, adult (HCC) 10/22/2022   Encounter for IUD insertion 08/03/2020   Increased frequency of urination 03/18/2020   Encounter for Papanicolaou smear for cervical cancer screening 03/18/2020   Amenorrhea 12/17/2019   Chronic migraine without aura without status migrainosus, not intractable 11/01/2019   Current moderate episode of major depressive disorder without prior episode (HCC) 11/01/2019   Elevated LFTs 11/01/2019   Positive depression screening 11/01/2019   Encounter for other general counseling and advice on contraception 11/01/2019   Chronic daily headache 07/02/2019  Need for immunization against influenza 07/02/2019   Morbid obesity with BMI of 60.0-69.9, adult (HCC) 07/02/2019   Encounter for hepatitis C screening test for low risk patient 07/02/2019   Dietary counseling 07/02/2019   Screening for diabetes mellitus 07/02/2019   Exercise  counseling 07/02/2019   Encounter for screening for HIV 07/02/2019   Seasonal allergic rhinitis due to pollen 07/02/2019    Allergies: No Known Allergies Medications:  Current Outpatient Medications:    benzonatate  (TESSALON ) 100 MG capsule, Take 1 capsule (100 mg total) by mouth 3 (three) times daily as needed for cough., Disp: 30 capsule, Rfl: 0   fluticasone  (FLONASE ) 50 MCG/ACT nasal spray, Place 2 sprays into both nostrils daily., Disp: 16 g, Rfl: 0   predniSONE  (STERAPRED UNI-PAK 21 TAB) 10 MG (21) TBPK tablet, Take following package directions, Disp: 21 tablet, Rfl: 0   hydrochlorothiazide  (HYDRODIURIL ) 25 MG tablet, Take 1 tablet (25 mg total) by mouth daily., Disp: 90 tablet, Rfl: 3   ondansetron  (ZOFRAN -ODT) 4 MG disintegrating tablet, Take 1 tablet (4 mg total) by mouth every 8 (eight) hours as needed for nausea or vomiting., Disp: 20 tablet, Rfl: 0  Observations/Objective: Patient is well-developed, well-nourished in no acute distress.  Resting comfortably at home.  Head is normocephalic, atraumatic.  No labored breathing. Speech is clear and coherent with logical content.  Patient is alert and oriented at baseline.   Assessment and Plan: 1. Other migraine without status migrainosus, not intractable (Primary) - fluticasone  (FLONASE ) 50 MCG/ACT nasal spray; Place 2 sprays into both nostrils daily.  Dispense: 16 g; Refill: 0 - benzonatate  (TESSALON ) 100 MG capsule; Take 1 capsule (100 mg total) by mouth 3 (three) times daily as needed for cough.  Dispense: 30 capsule; Refill: 0 - predniSONE  (STERAPRED UNI-PAK 21 TAB) 10 MG (21) TBPK tablet; Take following package directions  Dispense: 21 tablet; Refill: 0  2. Viral URI with cough - fluticasone  (FLONASE ) 50 MCG/ACT nasal spray; Place 2 sprays into both nostrils daily.  Dispense: 16 g; Refill: 0 - benzonatate  (TESSALON ) 100 MG capsule; Take 1 capsule (100 mg total) by mouth 3 (three) times daily as needed for cough.  Dispense: 30  capsule; Refill: 0  Sinus pressure with viral URI triggering migraine. Supportive measures and OTC medications reviewed. Prednisone  pack to break headache cycle and to reduce sinus inflammation. Flonase  and Tessalon  per orders. Strict in-person follow-up precautions reviewed.   Follow Up Instructions: I discussed the assessment and treatment plan with the patient. The patient was provided an opportunity to ask questions and all were answered. The patient agreed with the plan and demonstrated an understanding of the instructions.  A copy of instructions were sent to the patient via MyChart unless otherwise noted below.   The patient was advised to call back or seek an in-person evaluation if the symptoms worsen or if the condition fails to improve as anticipated.    Becky Velma Lunger, PA-C

## 2024-07-18 ENCOUNTER — Telehealth: Admitting: Physician Assistant

## 2024-07-18 DIAGNOSIS — A084 Viral intestinal infection, unspecified: Secondary | ICD-10-CM

## 2024-07-18 DIAGNOSIS — R112 Nausea with vomiting, unspecified: Secondary | ICD-10-CM

## 2024-07-18 MED ORDER — ONDANSETRON 4 MG PO TBDP: 4.0000 mg | ORAL_TABLET | Freq: Three times a day (TID) | ORAL | 0 refills | Status: AC | PRN

## 2024-07-18 NOTE — Progress Notes (Signed)
 Virtual Visit Consent   Becky Porter, you are scheduled for a virtual visit with a [facility] provider today. Just as with appointments in the office, your consent must be obtained to participate. Your consent will be active for this visit and any virtual visit you may have with one of our providers in the next 365 days. If you have a MyChart account, a copy of this consent can be sent to you electronically.  As this is a virtual visit, video technology does not allow for your provider to perform a traditional examination. This may limit your provider's ability to fully assess your condition. If your provider identifies any concerns that need to be evaluated in person or the need to arrange testing (such as labs, EKG, etc.), we will make arrangements to do so. Although advances in technology are sophisticated, we cannot ensure that it will always work on either your end or our end. If the connection with a video visit is poor, the visit may have to be switched to a telephone visit. With either a video or telephone visit, we are not always able to ensure that we have a secure connection.  By engaging in this virtual visit, you consent to the provision of healthcare and authorize for your insurance to be billed (if applicable) for the services provided during this visit. Depending on your insurance coverage, you may receive a charge related to this service.  I need to obtain your verbal consent now. Are you willing to proceed with your visit today? Becky Porter has provided verbal consent on 07/18/2024 for a virtual visit (video or telephone). Becky Porter, [place]  Date: 07/18/2024 4:15 PM   Virtual Visit via Video Note   I, Becky Porter, connected with  Becky Porter  ([id number], 08-Nov-1990) on 07/18/24 at  4:00 PM EDT by a video-enabled telemedicine application and verified that I am speaking with the correct person using two identifiers.  Location: Patient: Virtual Visit  Location Patient: Home Provider: Virtual Visit Location Provider: Home Office   I discussed the limitations of evaluation and management by telemedicine and the availability of in person appointments. The patient expressed understanding and agreed to proceed.    History of Present Illness: Becky Porter is a 33 y.o. who identifies as a female who was assigned female at birth, and is being seen today for stomach upset including diarrhea, nausea and non-bloody emesis starting last night. Denies fever, chills. Mild headache. Denies melena, hematochezia or tenesmus. 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 Did have some [employer] yesterday -- nothing out of the ordinary.   HPI: HPI  Problems:  Patient Active Problem List   Diagnosis Date Noted   IUD (intrauterine device) in place 06/16/2023   Alpha thalassemia silent carrier 11/09/2022   Benign essential hypertension 10/22/2022   BMI 60.0-69.9, adult (HCC) 10/22/2022   Encounter for IUD insertion 08/03/2020   Increased frequency of urination 03/18/2020   Encounter for Papanicolaou smear for cervical cancer screening 03/18/2020   Amenorrhea 12/17/2019   Chronic migraine without aura without status migrainosus, not intractable 11/01/2019   Current moderate episode of major depressive disorder without prior episode (HCC) 11/01/2019   Elevated LFTs 11/01/2019   Positive depression screening 11/01/2019   Encounter for other general counseling and advice on contraception 11/01/2019   Chronic daily headache 07/02/2019   Need for immunization against influenza 07/02/2019   Morbid obesity with BMI of 60.0-69.9, adult (HCC) 07/02/2019   Encounter for hepatitis C  screening test for low risk patient 07/02/2019   Dietary counseling 07/02/2019   Screening for diabetes mellitus 07/02/2019   Exercise counseling 07/02/2019   Encounter for screening for HIV 07/02/2019   Seasonal allergic rhinitis due to pollen 07/02/2019     Allergies: No Known Allergies Medications:  Current Outpatient Medications:    ondansetron  (ZOFRAN -ODT) 4 MG disintegrating tablet, Take 1 tablet (4 mg total) by mouth every 8 (eight) hours as needed for nausea or vomiting., Disp: 20 tablet, Rfl: 0   hydrochlorothiazide  (HYDRODIURIL ) 25 MG tablet, Take 1 tablet (25 mg total) by mouth daily., Disp: 90 tablet, Rfl: 3  Observations/Objective: Patient is well-developed, well-nourished in no acute distress.  Resting comfortably at home.  Head is normocephalic, atraumatic.  No labored breathing. Speech is clear and coherent with logical content.  Patient is alert and oriented at baseline.   Assessment and Plan: 1. Viral gastroenteritis (Primary) - ondansetron  (ZOFRAN -ODT) 4 MG disintegrating tablet; Take 1 tablet (4 mg total) by mouth every 8 (eight) hours as needed for nausea or vomiting.  Dispense: 20 tablet; Refill: 0  Supportive measures and OTC medications reviewed. Start [employer] Sips of fluids. Imodium OTC. Zofran  per orders. Work note provided. Follow-up in person for any non-resolving, new or worsening symptoms despite treatment.  Follow Up Instructions: I discussed the assessment and treatment plan with the patient. The patient was provided an opportunity to ask questions and all were answered. The patient agreed with the plan and demonstrated an understanding of the instructions.  A copy of instructions were sent to the patient via MyChart unless otherwise noted below.   The patient was advised to call back or seek an in-person evaluation if the symptoms worsen or if the condition fails to improve as anticipated.    Becky Velma Lunger, PA-C

## 2024-07-18 NOTE — Patient Instructions (Signed)
 Becky Porter, thank you for joining Becky Velma Lunger, PA-C for today's virtual visit.  While this provider is not your primary care provider (PCP), if your PCP is located in our provider database this encounter information will be shared with them immediately following your visit.   A [facility] MyChart account gives you access to today's visit and all your visits, tests, and labs performed at [facility]  click here if you don't have a  MyChart account or go to [email or web address]  Consent: (Patient) Becky Porter provided verbal consent for this virtual visit at the beginning of the encounter.  Current Medications:  Current Outpatient Medications:    ondansetron  (ZOFRAN -ODT) 4 MG disintegrating tablet, Take 1 tablet (4 mg total) by mouth every 8 (eight) hours as needed for nausea or vomiting., Disp: 20 tablet, Rfl: 0   hydrochlorothiazide  (HYDRODIURIL ) 25 MG tablet, Take 1 tablet (25 mg total) by mouth daily., Disp: 90 tablet, Rfl: 3   Medications ordered in this encounter:  Meds ordered this encounter  Medications   ondansetron  (ZOFRAN -ODT) 4 MG disintegrating tablet    Sig: Take 1 tablet (4 mg total) by mouth every 8 (eight) hours as needed for nausea or vomiting.    Dispense:  20 tablet    Refill:  0    Supervising Provider:   LAMPTEY, PHILIP O [[id number]]     *If you need refills on other medications prior to your next appointment, please contact your pharmacy*  Follow-Up: Call back or seek an in-person evaluation if the symptoms worsen or if the condition fails to improve as anticipated.   Virtual Care [phone]  Other Instructions Hydrate and rest. Follow dietary recommendations below. Ok to start OTC Imodium or Pepto Bismol until you can start the Zofran  especially. If you note any non-resolving, new, or worsening symptoms despite treatment, please seek an in-person evaluation ASAP.   Viral Gastroenteritis,  Adult  Viral gastroenteritis is also known as the stomach flu. This condition may affect your stomach, your small intestine, and your large intestine. It can cause sudden watery poop (diarrhea), fever, and vomiting. This condition is caused by certain germs (viruses). These germs can be passed from person to person very easily (are contagious). Having watery poop and vomiting can make you feel weak and cause you to not have enough water in your body (get dehydrated). This can make you tired and thirsty, make you have a dry mouth, and make it so you pee (urinate) less often. It is important to replace the fluids that you lose from having watery poop and vomiting. What are the causes? You can get sick by catching germs from other people. You can also get sick by: Eating food, drinking water, or touching a surface that has the germs on it (is contaminated). Sharing utensils or other personal items with a person who is sick. What increases the risk? Having a weak body defense system (immune system). Living with one or more children who are younger than 2 years. Living in a nursing home. Going on cruise ships. What are the signs or symptoms? Symptoms of this condition start suddenly. Symptoms may last for a few days or for as long as a week. Common symptoms include: Watery poop. Vomiting. Other symptoms include: Fever. Headache. Feeling tired (fatigue). Pain in the belly (abdomen). Chills. Feeling weak. Feeling like you may vomit (nauseous). Muscle aches. Not feeling hungry. How is this treated? This condition typically goes away on its own. The focus of treatment  is to replace the fluids that you lose. This condition may be treated with: An ORS (oral rehydration solution). This is a drink that helps you replace fluids and minerals your body lost. It is sold at pharmacies and stores. Medicines to help with your symptoms. Probiotic supplements to reduce symptoms of watery poop. Fluids  given through an IV tube, if needed. Older adults and people with other diseases or a weak body defense system are at higher risk for not having enough water in the body. Follow these instructions at home: Eating and drinking  Take an ORS as told by your doctor. Drink clear fluids in small amounts as you are able. Clear fluids include: Water. Ice chips. Fruit juice that has water added to it (is diluted). Low-calorie sports drinks. Drink enough fluid to keep your pee (urine) pale yellow. Eat small amounts of healthy foods every 3-4 hours as you are able. This may include whole grains, fruits, vegetables, lean meats, and yogurt. Avoid fluids that have a lot of sugar or caffeine  in them. This includes energy drinks, sports drinks, and soda. Avoid spicy or fatty foods. Avoid alcohol. General instructions  Wash your hands often. This is very important after you have watery poop or you vomit. If you cannot use soap and water, use hand sanitizer. Make sure that all people in your home wash their hands well and often. Take over-the-counter and prescription medicines only as told by your doctor. Rest at home while you get better. Watch your condition for any changes. Take a warm bath to help with any burning or pain from having watery poop. Keep all follow-up visits. Contact a doctor if: You cannot keep fluids down. Your symptoms get worse. You have new symptoms. You feel light-headed or dizzy. You have muscle cramps. Get help right away if: You have chest pain. You have trouble breathing, or you are breathing very fast. You have a fast heartbeat. You feel very weak or you faint. You have a very bad headache, a stiff neck, or both. You have a rash. You have very bad pain, cramping, or bloating in your belly. Your skin feels cold and clammy. You feel mixed up (confused). You have pain when you pee. You have signs of not having enough water in the body, such as: Dark pee, hardly any  pee, or no pee. Cracked lips. Dry mouth. Sunken eyes. Feeling very sleepy. Feeling weak. You have signs of bleeding, such as: You see blood in your vomit. Your vomit looks like coffee grounds. You have bloody or black poop or poop that looks like tar. These symptoms may be an emergency. Get help right away. Call 911. Do not wait to see if the symptoms will go away. Do not drive yourself to the hospital. Summary Viral gastroenteritis is also known as the stomach flu. This condition can cause sudden watery poop (diarrhea), fever, and vomiting. These germs can be passed from person to person very easily. Take an ORS (oral rehydration solution) as told by your doctor. This is a drink that is sold at pharmacies and stores. Wash your hands often, especially after having watery poop or vomiting. If you cannot use soap and water, use hand sanitizer. This information is not intended to replace advice given to you by your health care provider. Make sure you discuss any questions you have with your health care provider. Document Revised: 07/12/2021 Document Reviewed: 07/12/2021 Elsevier Patient Education  2024 Elsevier Inc.   If you have been instructed to  have an in-person evaluation today at a local Urgent Care facility, please use the link below. It will take you to a list of all of our available [facility], including address, phone number and hours of operation. Please do not delay care.  [facility]  If you or a family member do not have a primary care provider, use the link below to schedule a visit and establish care. When you choose a [facility] primary care physician or advanced practice provider, you gain a long-term partner in health. Find a Primary Care Provider  Learn more about [facility]'s in-office and virtual care options: [facility] - Get Care Now

## 2024-07-18 NOTE — Progress Notes (Signed)
  Because you have had multiple visits for similar issues recently without an in person exam, I feel your condition warrants further evaluation and I recommend that you be seen in a face-to-face visit.   NOTE: There will be NO CHARGE for this E-Visit   If you are having a true medical emergency, please call 911.     For an urgent face to face visit, [facility] has multiple urgent care centers for your convenience.  Click the link below for the full list of locations and hours, walk-in wait times, appointment scheduling options and driving directions:  Urgent Care - [place], [place], [place], [place], [place], [place]  [place]     Your MyChart E-visit questionnaire answers were reviewed by a board certified advanced clinical practitioner to complete your personal care plan based on your specific symptoms.    Thank you for using e-Visits.

## 2024-10-07 ENCOUNTER — Telehealth: Admitting: Family Medicine

## 2024-10-07 DIAGNOSIS — J069 Acute upper respiratory infection, unspecified: Secondary | ICD-10-CM

## 2024-10-07 MED ORDER — PROMETHAZINE-DM 6.25-15 MG/5ML PO SYRP: 5.0000 mL | ORAL_SOLUTION | Freq: Four times a day (QID) | ORAL | 0 refills | Status: AC | PRN

## 2024-10-07 NOTE — Patient Instructions (Signed)

## 2024-10-07 NOTE — Progress Notes (Signed)
 " Virtual Visit Consent   Becky Porter, you are scheduled for a virtual visit with a [facility] provider today. Just as with appointments in the office, your consent must be obtained to participate. Your consent will be active for this visit and any virtual visit you may have with one of our providers in the next 365 days. If you have a MyChart account, a copy of this consent can be sent to you electronically.  As this is a virtual visit, video technology does not allow for your provider to perform a traditional examination. This may limit your provider's ability to fully assess your condition. If your provider identifies any concerns that need to be evaluated in person or the need to arrange testing (such as labs, EKG, etc.), we will make arrangements to do so. Although advances in technology are sophisticated, we cannot ensure that it will always work on either your end or our end. If the connection with a video visit is poor, the visit may have to be switched to a telephone visit. With either a video or telephone visit, we are not always able to ensure that we have a secure connection.  By engaging in this virtual visit, you consent to the provision of healthcare and authorize for your insurance to be billed (if applicable) for the services provided during this visit. Depending on your insurance coverage, you may receive a charge related to this service.  I need to obtain your verbal consent now. Are you willing to proceed with your visit today? [name] has provided verbal consent on 10/07/2024 for a virtual visit (video or telephone). [name], FNP  Date: 10/07/2024 5:14 PM   Virtual Visit via Video Note   I, [name], connected with  [name]  ([id number], 05-Jan-1991) on 10/07/2024 at  5:30 PM EST by a video-enabled telemedicine application and verified that I am speaking with the correct person using two identifiers.  Location: Patient: Virtual Visit Location Patient:  Home Provider: Virtual Visit Location Provider: Home Office   I discussed the limitations of evaluation and management by telemedicine and the availability of in person appointments. The patient expressed understanding and agreed to proceed.    History of Present Illness: Becky Porter is a 34 y.o. who identifies as a female who was assigned female at birth, and is being seen today for cough, mucus with yellow color,Sx since 5-6 days worsening. No fever. Mild sinus pressure. Diarrhea today.   HPI: HPI  Problems:  Patient Active Problem List   Diagnosis Date Noted   IUD (intrauterine device) in place 06/16/2023   Alpha thalassemia silent carrier 11/09/2022   Benign essential hypertension 10/22/2022   BMI 60.0-69.9, adult (HCC) 10/22/2022   Encounter for IUD insertion 08/03/2020   Increased frequency of urination 03/18/2020   Encounter for Papanicolaou smear for cervical cancer screening 03/18/2020   Amenorrhea 12/17/2019   Chronic migraine without aura without status migrainosus, not intractable 11/01/2019   Current moderate episode of major depressive disorder without prior episode (HCC) 11/01/2019   Elevated LFTs 11/01/2019   Positive depression screening 11/01/2019   Encounter for other general counseling and advice on contraception 11/01/2019   Chronic daily headache 07/02/2019   Need for immunization against influenza 07/02/2019   Morbid obesity with BMI of 60.0-69.9, adult (HCC) 07/02/2019   Encounter for hepatitis C screening test for low risk patient 07/02/2019   Dietary counseling 07/02/2019   Screening for diabetes mellitus 07/02/2019   Exercise counseling 07/02/2019   Encounter  for screening for HIV 07/02/2019   Seasonal allergic rhinitis due to pollen 07/02/2019    Allergies: Allergies[1] Medications: Current Medications[2]  Observations/Objective: Patient is well-developed, well-nourished in no acute distress.  Resting comfortably  at home.  Head is  normocephalic, atraumatic.  No labored breathing.  Speech is clear and coherent with logical content.  Patient is alert and oriented at baseline.    Assessment and Plan: 1. Viral URI with cough (Primary)  Increase fluids, humidifier at night, tylenol , UC as needed.   Follow Up Instructions: I discussed the assessment and treatment plan with the patient. The patient was provided an opportunity to ask questions and all were answered. The patient agreed with the plan and demonstrated an understanding of the instructions.  A copy of instructions were sent to the patient via MyChart unless otherwise noted below.     The patient was advised to call back or seek an in-person evaluation if the symptoms worsen or if the condition fails to improve as anticipated.    [name], FNP      [1] No Known Allergies [2]  Current Outpatient Medications:    promethazine -dextromethorphan (PROMETHAZINE -DM) 6.25-15 MG/5ML syrup, Take 5 mLs by mouth 4 (four) times daily as needed for up to 10 days for cough., Disp: 118 mL, Rfl: 0   hydrochlorothiazide  (HYDRODIURIL ) 25 MG tablet, Take 1 tablet (25 mg total) by mouth daily., Disp: 90 tablet, Rfl: 3   ondansetron  (ZOFRAN -ODT) 4 MG disintegrating tablet, Take 1 tablet (4 mg total) by mouth every 8 (eight) hours as needed for nausea or vomiting., Disp: 20 tablet, Rfl: 0  "

## 2024-10-22 ENCOUNTER — Encounter: Payer: Self-pay | Admitting: Cardiology

## 2024-10-22 ENCOUNTER — Telehealth: Admitting: Family Medicine

## 2024-10-22 DIAGNOSIS — R112 Nausea with vomiting, unspecified: Secondary | ICD-10-CM

## 2024-10-22 MED ORDER — ONDANSETRON HCL 4 MG PO TABS: 4.0000 mg | ORAL_TABLET | Freq: Three times a day (TID) | ORAL | 0 refills | Status: AC | PRN

## 2024-10-22 NOTE — Patient Instructions (Signed)
 Nausea and Vomiting, Adult Nausea is the feeling that you have an upset stomach or that you are about to vomit. As nausea gets worse, it can lead to vomiting. Vomiting is when stomach contents forcefully come out of your mouth as a result of nausea. Vomiting can make you feel weak and cause you to become dehydrated. Dehydration can make you feel tired and thirsty, cause you to have a dry mouth, and decrease how often you urinate. Older adults and people with other diseases or a weak disease-fighting system (immune system) are at higher risk for dehydration. It is important to treat your nausea and vomiting as told by your health care provider. Follow these instructions at home: Watch your symptoms for any changes. Tell your health care provider about them. Eating and drinking     Take an oral rehydration solution (ORS). This is a drink that is sold at pharmacies and retail stores. Drink clear fluids slowly and in small amounts as you are able. Clear fluids include water, ice chips, low-calorie sports drinks, and fruit juice that has water added (diluted fruit juice). Eat bland, easy-to-digest foods in small amounts as you are able. These foods include bananas, applesauce, rice, lean meats, toast, and crackers. Avoid fluids that contain a lot of sugar or caffeine, such as energy drinks, sports drinks, and soda. Avoid alcohol. Avoid spicy or fatty foods. General instructions Take over-the-counter and prescription medicines only as told by your health care provider. Drink enough fluid to keep your urine pale yellow. Wash your hands often using soap and water for at least 20 seconds. If soap and water are not available, use hand sanitizer. Make sure that everyone in your household washes their hands well and often. Rest at home while you recover. Watch your condition for any changes. Take slow and deep breaths when you feel nauseous. Keep all follow-up visits. This is important. Contact a health  care provider if: Your symptoms get worse. You have new symptoms. You have a fever. You cannot drink fluids without vomiting. Your nausea does not go away after 2 days. You feel light-headed or dizzy. You have a headache. You have muscle cramps. You have a rash. You have pain while urinating. Get help right away if: You have pain in your chest, neck, arm, or jaw. You feel extremely weak or you faint. You have persistent vomiting. You have vomit that is bright red or looks like black coffee grounds. You have bloody or black stools (feces) or stools that look like tar. You have a severe headache, a stiff neck, or both. You have severe pain, cramping, or bloating in your abdomen. You have difficulty breathing, or you are breathing very quickly. Your heart is beating very quickly. Your skin feels cold and clammy. You feel confused. You have signs of dehydration, such as: Dark urine, very little urine, or no urine. Cracked lips. Dry mouth. Sunken eyes. Sleepiness. Weakness. These symptoms may be an emergency. Get help right away. Call 911. Do not wait to see if the symptoms will go away. Do not drive yourself to the hospital. Summary Nausea is the feeling that you have an upset stomach or that you are about to vomit. As nausea gets worse, it can lead to vomiting. Vomiting can make you feel weak and cause you to become dehydrated. Follow instructions from your health care provider about eating and drinking to prevent dehydration. Take over-the-counter and prescription medicines only as told by your health care provider. Contact your health care  provider if your symptoms get worse, or you have new symptoms. Keep all follow-up visits. This is important. This information is not intended to replace advice given to you by your health care provider. Make sure you discuss any questions you have with your health care provider. Document Revised: 03/19/2021 Document Reviewed:  03/19/2021 Elsevier Patient Education  2024 [employer].

## 2024-10-22 NOTE — Progress Notes (Signed)
 " Virtual Visit Consent   Becky Porter, you are scheduled for a virtual visit with a [facility] provider today. Just as with appointments in the office, your consent must be obtained to participate. Your consent will be active for this visit and any virtual visit you may have with one of our providers in the next 365 days. If you have a MyChart account, a copy of this consent can be sent to you electronically.  As this is a virtual visit, video technology does not allow for your provider to perform a traditional examination. This may limit your provider's ability to fully assess your condition. If your provider identifies any concerns that need to be evaluated in person or the need to arrange testing (such as labs, EKG, etc.), we will make arrangements to do so. Although advances in technology are sophisticated, we cannot ensure that it will always work on either your end or our end. If the connection with a video visit is poor, the visit may have to be switched to a telephone visit. With either a video or telephone visit, we are not always able to ensure that we have a secure connection.  By engaging in this virtual visit, you consent to the provision of healthcare and authorize for your insurance to be billed (if applicable) for the services provided during this visit. Depending on your insurance coverage, you may receive a charge related to this service.  I need to obtain your verbal consent now. Are you willing to proceed with your visit today? [name] has provided verbal consent on 10/22/2024 for a virtual visit (video or telephone). [name], FNP  Date: 10/22/2024 7:36 PM   Virtual Visit via Video Note   I, [name], connected with  Becky Porter  ([id number], December 05, 1990) on 10/22/24 at  7:30 PM EST by a video-enabled telemedicine application and verified that I am speaking with the correct person using two identifiers.  Location: Patient: Virtual Visit Location Patient:  Home Provider: Virtual Visit Location Provider: Home Office   I discussed the limitations of evaluation and management by telemedicine and the availability of in person appointments. The patient expressed understanding and agreed to proceed.    History of Present Illness: Becky Porter is a 34 y.o. who identifies as a female who was assigned female at birth, and is being seen today for vomiting at times depending on what she eats, preg test neg, no diarrhea, no fever, no cough, wheezing sob. She is not dehydrated and is able to keep down fluids.   HPI: HPI  Problems:  Patient Active Problem List   Diagnosis Date Noted   IUD (intrauterine device) in place 06/16/2023   Alpha thalassemia silent carrier 11/09/2022   Benign essential hypertension 10/22/2022   BMI 60.0-69.9, adult (HCC) 10/22/2022   Encounter for IUD insertion 08/03/2020   Increased frequency of urination 03/18/2020   Encounter for Papanicolaou smear for cervical cancer screening 03/18/2020   Amenorrhea 12/17/2019   Chronic migraine without aura without status migrainosus, not intractable 11/01/2019   Current moderate episode of major depressive disorder without prior episode (HCC) 11/01/2019   Elevated LFTs 11/01/2019   Positive depression screening 11/01/2019   Encounter for other general counseling and advice on contraception 11/01/2019   Chronic daily headache 07/02/2019   Need for immunization against influenza 07/02/2019   Morbid obesity with BMI of 60.0-69.9, adult (HCC) 07/02/2019   Encounter for hepatitis C screening test for low risk patient 07/02/2019   Dietary counseling 07/02/2019  Screening for diabetes mellitus 07/02/2019   Exercise counseling 07/02/2019   Encounter for screening for HIV 07/02/2019   Seasonal allergic rhinitis due to pollen 07/02/2019    Allergies: Allergies[1] Medications: Current Medications[2]  Observations/Objective: Patient is well-developed, well-nourished in no acute  distress.  Resting comfortably  at home.  Head is normocephalic, atraumatic.  No labored breathing.  Speech is clear and coherent with logical content.  Patient is alert and oriented at baseline.    Assessment and Plan: 1. Nausea and vomiting, unspecified vomiting type (Primary)  Increase fluids, no milk or dairy, UC or pcp if sx persist or worsen   Follow Up Instructions: I discussed the assessment and treatment plan with the patient. The patient was provided an opportunity to ask questions and all were answered. The patient agreed with the plan and demonstrated an understanding of the instructions.  A copy of instructions were sent to the patient via MyChart unless otherwise noted below.     The patient was advised to call back or seek an in-person evaluation if the symptoms worsen or if the condition fails to improve as anticipated.    [name], FNP     [1] No Known Allergies [2]  Current Outpatient Medications:    hydrochlorothiazide  (HYDRODIURIL ) 25 MG tablet, Take 1 tablet (25 mg total) by mouth daily., Disp: 90 tablet, Rfl: 3   ondansetron  (ZOFRAN -ODT) 4 MG disintegrating tablet, Take 1 tablet (4 mg total) by mouth every 8 (eight) hours as needed for nausea or vomiting., Disp: 20 tablet, Rfl: 0  "

## 2024-10-29 ENCOUNTER — Encounter: Payer: Self-pay | Admitting: Podiatry

## 2024-10-29 ENCOUNTER — Ambulatory Visit: Payer: Self-pay | Admitting: Podiatry

## 2024-10-29 ENCOUNTER — Ambulatory Visit (INDEPENDENT_AMBULATORY_CARE_PROVIDER_SITE_OTHER)

## 2024-10-29 DIAGNOSIS — M722 Plantar fascial fibromatosis: Secondary | ICD-10-CM

## 2024-10-29 MED ORDER — MELOXICAM 15 MG PO TABS: 15.0000 mg | ORAL_TABLET | Freq: Every day | ORAL | 0 refills | Status: AC

## 2024-10-29 NOTE — Patient Instructions (Signed)

## 2024-10-29 NOTE — Progress Notes (Unsigned)
"  °  Subjective:  Patient ID: Becky Porter, female    DOB: 26-Jan-1991,   MRN: [id number]  Chief Complaint  Patient presents with   Foot Pain    I had a cut on the ball of my left foot. My foot hurts on the bottom in the middle of my foot.    34 y.o. female presents for concern of cut on the ball of her left foot. Relates she has been getting pain in the middle of her left foot.  She also relates pain in the right foot that usually is more significant but was concerned that she might have a foreign body in the left foot and wanted to have it checked out.  She relates for steps in the morning are usually the most painful and then they get better as the day goes on and then worse at the end of the evening.  She denies any current treatments.  She has used ibuprofen  which might help some for some of her heel pain.. Denies any other pedal complaints. Denies n/v/f/c.   Past Medical History:  Diagnosis Date   Hypertension    Pregnancy induced hypertension     Objective:  Physical Exam: Vascular: DP/PT pulses 2/4 bilateral. CFT <3 seconds. Normal hair growth on digits. No edema.  Skin. No lacerations or abrasions bilateral feet.  No obvious foreign bodies noted.  No major hyperkeratotic lesions.  No open ulcerations noted. Musculoskeletal: MMT 5/5 bilateral lower extremities in DF, PF, Inversion and Eversion. Deceased ROM in DF of ankle joint. Tender to the medial calcaneal tubercle bilaterally . No pain with achilles, PT or arch. No pain with calcaneal squeeze.  No obvious foreign bodies noted. Neurological: Sensation intact to light touch.   Assessment:   1. Plantar fasciitis of left foot   2. Plantar fasciitis, right      Plan:  Patient was evaluated and treated and all questions answered. Discussed plantar fasciitis with patient.  X-rays reviewed and discussed with patient. No acute fractures or dislocations noted. Mild spurring noted at inferior calcaneus.  Discussed treatment  options including, ice, NSAIDS, supportive shoes, bracing, and stretching. Stretching exercises provided to be done on a daily basis.   Prescription for meloxicam  provided and sent to pharmacy.  Kidney function labs within normal limits. PF brace dispensed bilateral. Follow-up 6 weeks or sooner if any problems arise. In the meantime, encouraged to call the office with any questions, concerns, change in symptoms.     Asberry Failing, DPM    "

## 2024-11-04 ENCOUNTER — Ambulatory Visit: Admitting: Family Medicine

## 2024-11-21 ENCOUNTER — Ambulatory Visit: Admitting: Family Medicine

## 2024-12-11 ENCOUNTER — Ambulatory Visit: Admitting: Podiatry

## 2025-02-25 ENCOUNTER — Ambulatory Visit: Admitting: Cardiology
